# Patient Record
Sex: Male | Born: 1935 | Race: White | Hispanic: No | Marital: Married | Smoking: Former smoker
Health system: Southern US, Community
[De-identification: ages and names within clinical notes are randomized; demographics above are authoritative.]

## PROBLEM LIST (undated history)

## (undated) DIAGNOSIS — K648 Other hemorrhoids: Secondary | ICD-10-CM

## (undated) DIAGNOSIS — K222 Esophageal obstruction: Secondary | ICD-10-CM

## (undated) DIAGNOSIS — M1990 Unspecified osteoarthritis, unspecified site: Secondary | ICD-10-CM

## (undated) DIAGNOSIS — E785 Hyperlipidemia, unspecified: Secondary | ICD-10-CM

## (undated) DIAGNOSIS — J189 Pneumonia, unspecified organism: Secondary | ICD-10-CM

## (undated) DIAGNOSIS — K449 Diaphragmatic hernia without obstruction or gangrene: Secondary | ICD-10-CM

## (undated) DIAGNOSIS — J439 Emphysema, unspecified: Secondary | ICD-10-CM

## (undated) DIAGNOSIS — I10 Essential (primary) hypertension: Secondary | ICD-10-CM

## (undated) DIAGNOSIS — N183 Chronic kidney disease, stage 3 unspecified: Secondary | ICD-10-CM

## (undated) DIAGNOSIS — K219 Gastro-esophageal reflux disease without esophagitis: Secondary | ICD-10-CM

## (undated) DIAGNOSIS — C159 Malignant neoplasm of esophagus, unspecified: Secondary | ICD-10-CM

## (undated) DIAGNOSIS — I480 Paroxysmal atrial fibrillation: Secondary | ICD-10-CM

## (undated) DIAGNOSIS — K5790 Diverticulosis of intestine, part unspecified, without perforation or abscess without bleeding: Secondary | ICD-10-CM

## (undated) DIAGNOSIS — I639 Cerebral infarction, unspecified: Secondary | ICD-10-CM

## (undated) DIAGNOSIS — K2270 Barrett's esophagus without dysplasia: Secondary | ICD-10-CM

## (undated) HISTORY — PX: CATARACT EXTRACTION W/ INTRAOCULAR LENS  IMPLANT, BILATERAL: SHX1307

## (undated) HISTORY — DX: Chronic kidney disease, stage 3 unspecified: N18.30

## (undated) HISTORY — DX: Diaphragmatic hernia without obstruction or gangrene: K44.9

## (undated) HISTORY — DX: Diverticulosis of intestine, part unspecified, without perforation or abscess without bleeding: K57.90

## (undated) HISTORY — DX: Hyperlipidemia, unspecified: E78.5

## (undated) HISTORY — PX: INGUINAL HERNIA REPAIR: SUR1180

## (undated) HISTORY — DX: Barrett's esophagus without dysplasia: K22.70

## (undated) HISTORY — DX: Gastro-esophageal reflux disease without esophagitis: K21.9

## (undated) HISTORY — PX: APPENDECTOMY: SHX54

## (undated) HISTORY — DX: Malignant neoplasm of esophagus, unspecified: C15.9

## (undated) HISTORY — DX: Chronic kidney disease, stage 3 (moderate): N18.3

## (undated) HISTORY — DX: Other hemorrhoids: K64.8

## (undated) HISTORY — PX: TONSILLECTOMY: SUR1361

## (undated) HISTORY — DX: Paroxysmal atrial fibrillation: I48.0

## (undated) HISTORY — DX: Unspecified osteoarthritis, unspecified site: M19.90

## (undated) HISTORY — DX: Cerebral infarction, unspecified: I63.9

## (undated) HISTORY — PX: CYSTOSCOPY W/ URETEROSCOPY W/ LITHOTRIPSY: SUR380

## (undated) HISTORY — DX: Essential (primary) hypertension: I10

## (undated) HISTORY — DX: Esophageal obstruction: K22.2

---

## 1988-05-07 DIAGNOSIS — K2270 Barrett's esophagus without dysplasia: Secondary | ICD-10-CM

## 1988-05-07 HISTORY — DX: Barrett's esophagus without dysplasia: K22.70

## 1995-02-05 DIAGNOSIS — C159 Malignant neoplasm of esophagus, unspecified: Secondary | ICD-10-CM

## 1995-02-05 HISTORY — DX: Malignant neoplasm of esophagus, unspecified: C15.9

## 1995-02-05 HISTORY — PX: PARTIAL ESOPHAGECTOMY: SHX5287

## 1999-03-03 ENCOUNTER — Ambulatory Visit (HOSPITAL_COMMUNITY): Admission: RE | Admit: 1999-03-03 | Discharge: 1999-03-04 | Payer: Self-pay | Admitting: Nephrology

## 1999-03-03 ENCOUNTER — Encounter: Payer: Self-pay | Admitting: Nephrology

## 1999-03-05 ENCOUNTER — Emergency Department (HOSPITAL_COMMUNITY): Admission: EM | Admit: 1999-03-05 | Discharge: 1999-03-05 | Payer: Self-pay | Admitting: Emergency Medicine

## 1999-09-07 ENCOUNTER — Encounter: Payer: Self-pay | Admitting: Urology

## 1999-09-07 ENCOUNTER — Encounter: Admission: RE | Admit: 1999-09-07 | Discharge: 1999-09-07 | Payer: Self-pay | Admitting: Urology

## 2000-03-05 ENCOUNTER — Encounter: Admission: RE | Admit: 2000-03-05 | Discharge: 2000-03-05 | Payer: Self-pay | Admitting: Urology

## 2000-03-05 ENCOUNTER — Encounter: Payer: Self-pay | Admitting: Urology

## 2000-08-20 ENCOUNTER — Encounter: Payer: Self-pay | Admitting: Gastroenterology

## 2000-08-20 ENCOUNTER — Ambulatory Visit (HOSPITAL_COMMUNITY): Admission: RE | Admit: 2000-08-20 | Discharge: 2000-08-20 | Payer: Self-pay | Admitting: Gastroenterology

## 2000-09-18 ENCOUNTER — Encounter: Payer: Self-pay | Admitting: Gastroenterology

## 2000-09-18 ENCOUNTER — Ambulatory Visit (HOSPITAL_COMMUNITY): Admission: RE | Admit: 2000-09-18 | Discharge: 2000-09-18 | Payer: Self-pay | Admitting: Gastroenterology

## 2001-03-31 ENCOUNTER — Encounter: Admission: RE | Admit: 2001-03-31 | Discharge: 2001-03-31 | Payer: Self-pay | Admitting: Cardiothoracic Surgery

## 2001-03-31 ENCOUNTER — Encounter: Payer: Self-pay | Admitting: Cardiothoracic Surgery

## 2001-07-18 ENCOUNTER — Encounter: Admission: RE | Admit: 2001-07-18 | Discharge: 2001-07-18 | Payer: Self-pay | Admitting: Urology

## 2001-07-18 ENCOUNTER — Encounter: Payer: Self-pay | Admitting: Urology

## 2002-08-04 ENCOUNTER — Encounter: Admission: RE | Admit: 2002-08-04 | Discharge: 2002-08-04 | Payer: Self-pay | Admitting: Urology

## 2002-08-04 ENCOUNTER — Encounter: Payer: Self-pay | Admitting: Urology

## 2003-08-06 HISTORY — PX: CYSTOSCOPY WITH STENT PLACEMENT: SHX5790

## 2003-08-12 ENCOUNTER — Encounter: Admission: RE | Admit: 2003-08-12 | Discharge: 2003-08-12 | Payer: Self-pay | Admitting: Urology

## 2003-08-16 ENCOUNTER — Ambulatory Visit (HOSPITAL_BASED_OUTPATIENT_CLINIC_OR_DEPARTMENT_OTHER): Admission: RE | Admit: 2003-08-16 | Discharge: 2003-08-16 | Payer: Self-pay | Admitting: Urology

## 2003-08-16 ENCOUNTER — Ambulatory Visit (HOSPITAL_COMMUNITY): Admission: RE | Admit: 2003-08-16 | Discharge: 2003-08-16 | Payer: Self-pay | Admitting: Urology

## 2003-08-19 ENCOUNTER — Ambulatory Visit (HOSPITAL_COMMUNITY): Admission: RE | Admit: 2003-08-19 | Discharge: 2003-08-19 | Payer: Self-pay | Admitting: Urology

## 2005-11-21 ENCOUNTER — Ambulatory Visit: Payer: Self-pay | Admitting: Gastroenterology

## 2005-12-24 ENCOUNTER — Ambulatory Visit (HOSPITAL_COMMUNITY): Admission: RE | Admit: 2005-12-24 | Discharge: 2005-12-24 | Payer: Self-pay | Admitting: Gastroenterology

## 2005-12-26 ENCOUNTER — Ambulatory Visit: Payer: Self-pay | Admitting: Gastroenterology

## 2006-12-25 ENCOUNTER — Ambulatory Visit: Payer: Self-pay | Admitting: Gastroenterology

## 2007-01-31 ENCOUNTER — Ambulatory Visit: Payer: Self-pay | Admitting: Gastroenterology

## 2008-09-03 ENCOUNTER — Encounter: Payer: Self-pay | Admitting: Internal Medicine

## 2008-09-10 DIAGNOSIS — I4891 Unspecified atrial fibrillation: Secondary | ICD-10-CM

## 2008-09-13 ENCOUNTER — Ambulatory Visit: Payer: Self-pay | Admitting: Internal Medicine

## 2008-10-05 ENCOUNTER — Telehealth: Payer: Self-pay | Admitting: Internal Medicine

## 2008-10-05 ENCOUNTER — Encounter: Payer: Self-pay | Admitting: Internal Medicine

## 2008-10-05 HISTORY — PX: ATRIAL FIBRILLATION ABLATION: SHX5732

## 2008-10-11 ENCOUNTER — Telehealth: Payer: Self-pay | Admitting: Internal Medicine

## 2008-10-11 ENCOUNTER — Encounter: Payer: Self-pay | Admitting: Internal Medicine

## 2008-10-13 ENCOUNTER — Encounter: Payer: Self-pay | Admitting: Internal Medicine

## 2008-10-22 ENCOUNTER — Encounter: Payer: Self-pay | Admitting: Internal Medicine

## 2008-10-22 LAB — CONVERTED CEMR LAB: Protime: 48.9

## 2008-10-26 ENCOUNTER — Encounter: Payer: Self-pay | Admitting: Internal Medicine

## 2008-10-28 ENCOUNTER — Ambulatory Visit (HOSPITAL_COMMUNITY): Admission: RE | Admit: 2008-10-28 | Discharge: 2008-10-28 | Payer: Self-pay | Admitting: Cardiology

## 2008-10-28 ENCOUNTER — Encounter: Payer: Self-pay | Admitting: Internal Medicine

## 2008-10-29 ENCOUNTER — Ambulatory Visit: Payer: Self-pay | Admitting: Internal Medicine

## 2008-10-29 ENCOUNTER — Inpatient Hospital Stay (HOSPITAL_COMMUNITY): Admission: RE | Admit: 2008-10-29 | Discharge: 2008-10-30 | Payer: Self-pay | Admitting: Internal Medicine

## 2008-11-08 ENCOUNTER — Ambulatory Visit: Payer: Self-pay | Admitting: Internal Medicine

## 2008-11-08 ENCOUNTER — Inpatient Hospital Stay (HOSPITAL_COMMUNITY): Admission: EM | Admit: 2008-11-08 | Discharge: 2008-11-09 | Payer: Self-pay | Admitting: Emergency Medicine

## 2008-11-19 ENCOUNTER — Ambulatory Visit: Payer: Self-pay | Admitting: Internal Medicine

## 2008-11-29 ENCOUNTER — Encounter: Payer: Self-pay | Admitting: Internal Medicine

## 2009-01-04 ENCOUNTER — Encounter: Payer: Self-pay | Admitting: Internal Medicine

## 2009-01-28 ENCOUNTER — Telehealth: Payer: Self-pay | Admitting: Internal Medicine

## 2009-02-01 DIAGNOSIS — R079 Chest pain, unspecified: Secondary | ICD-10-CM

## 2009-02-01 DIAGNOSIS — E785 Hyperlipidemia, unspecified: Secondary | ICD-10-CM

## 2009-02-01 DIAGNOSIS — I10 Essential (primary) hypertension: Secondary | ICD-10-CM

## 2009-02-01 DIAGNOSIS — K2270 Barrett's esophagus without dysplasia: Secondary | ICD-10-CM | POA: Insufficient documentation

## 2009-02-01 DIAGNOSIS — K219 Gastro-esophageal reflux disease without esophagitis: Secondary | ICD-10-CM | POA: Insufficient documentation

## 2009-02-01 DIAGNOSIS — M109 Gout, unspecified: Secondary | ICD-10-CM

## 2009-02-02 ENCOUNTER — Ambulatory Visit: Payer: Self-pay | Admitting: Internal Medicine

## 2009-02-23 ENCOUNTER — Telehealth: Payer: Self-pay | Admitting: Internal Medicine

## 2009-02-25 ENCOUNTER — Telehealth: Payer: Self-pay | Admitting: Internal Medicine

## 2009-03-10 ENCOUNTER — Ambulatory Visit: Payer: Self-pay | Admitting: Internal Medicine

## 2009-04-25 ENCOUNTER — Encounter: Payer: Self-pay | Admitting: Internal Medicine

## 2009-05-03 ENCOUNTER — Ambulatory Visit: Payer: Self-pay | Admitting: Internal Medicine

## 2009-05-03 ENCOUNTER — Ambulatory Visit: Payer: Self-pay | Admitting: Cardiology

## 2009-05-03 ENCOUNTER — Encounter (INDEPENDENT_AMBULATORY_CARE_PROVIDER_SITE_OTHER): Payer: Self-pay | Admitting: *Deleted

## 2009-05-03 LAB — CONVERTED CEMR LAB
CO2: 30 meq/L (ref 19–32)
Calcium: 8.6 mg/dL (ref 8.4–10.5)
Chloride: 105 meq/L (ref 96–112)
HCT: 41.4 % (ref 39.0–52.0)
Hemoglobin: 14.2 g/dL (ref 13.0–17.0)
INR: 3.2 — ABNORMAL HIGH (ref 0.8–1.0)
MCHC: 34.2 g/dL (ref 30.0–36.0)
MCV: 97.2 fL (ref 78.0–100.0)
Neutrophils Relative %: 60 % (ref 43.0–77.0)
POC INR: 3.2
Platelets: 159 10*3/uL (ref 150.0–400.0)
Potassium: 4.5 meq/L (ref 3.5–5.1)
Prothrombin Time: 33.4 s — ABNORMAL HIGH (ref 9.1–11.7)
RBC: 4.26 M/uL (ref 4.22–5.81)
RDW: 12.8 % (ref 11.5–14.6)
Sodium: 140 meq/L (ref 135–145)

## 2009-05-05 ENCOUNTER — Ambulatory Visit: Payer: Self-pay | Admitting: Cardiovascular Disease

## 2009-05-05 ENCOUNTER — Ambulatory Visit (HOSPITAL_COMMUNITY): Admission: RE | Admit: 2009-05-05 | Discharge: 2009-05-05 | Payer: Self-pay | Admitting: Cardiovascular Disease

## 2009-05-07 HISTORY — PX: CARDIAC ELECTROPHYSIOLOGY STUDY AND ABLATION: SHX1294

## 2009-05-07 HISTORY — PX: ATRIAL ABLATION SURGERY: SHX560

## 2009-05-09 ENCOUNTER — Ambulatory Visit: Payer: Self-pay | Admitting: Cardiology

## 2009-05-09 ENCOUNTER — Encounter (INDEPENDENT_AMBULATORY_CARE_PROVIDER_SITE_OTHER): Payer: Self-pay | Admitting: Cardiology

## 2009-05-10 ENCOUNTER — Ambulatory Visit: Payer: Self-pay | Admitting: Internal Medicine

## 2009-05-10 ENCOUNTER — Inpatient Hospital Stay (HOSPITAL_COMMUNITY): Admission: RE | Admit: 2009-05-10 | Discharge: 2009-05-11 | Payer: Self-pay | Admitting: Internal Medicine

## 2009-05-19 ENCOUNTER — Ambulatory Visit: Payer: Self-pay | Admitting: Cardiology

## 2009-05-19 LAB — CONVERTED CEMR LAB: POC INR: 2.5

## 2009-06-16 ENCOUNTER — Ambulatory Visit: Payer: Self-pay | Admitting: Cardiology

## 2009-06-16 ENCOUNTER — Encounter (INDEPENDENT_AMBULATORY_CARE_PROVIDER_SITE_OTHER): Payer: Self-pay | Admitting: Cardiology

## 2009-06-16 LAB — CONVERTED CEMR LAB: POC INR: 2.6

## 2009-07-18 ENCOUNTER — Ambulatory Visit: Payer: Self-pay | Admitting: Cardiology

## 2009-07-18 LAB — CONVERTED CEMR LAB: POC INR: 2.2

## 2009-08-12 ENCOUNTER — Ambulatory Visit: Payer: Self-pay | Admitting: Cardiology

## 2009-08-12 ENCOUNTER — Ambulatory Visit: Payer: Self-pay | Admitting: Internal Medicine

## 2009-08-12 LAB — CONVERTED CEMR LAB: POC INR: 1.9

## 2009-09-09 ENCOUNTER — Ambulatory Visit: Payer: Self-pay | Admitting: Cardiology

## 2009-10-07 ENCOUNTER — Ambulatory Visit: Payer: Self-pay | Admitting: Cardiovascular Disease

## 2009-10-07 LAB — CONVERTED CEMR LAB: POC INR: 2.4

## 2009-11-14 ENCOUNTER — Ambulatory Visit: Payer: Self-pay | Admitting: Internal Medicine

## 2009-11-14 ENCOUNTER — Encounter: Payer: Self-pay | Admitting: Cardiology

## 2009-12-08 ENCOUNTER — Telehealth: Payer: Self-pay | Admitting: Internal Medicine

## 2010-01-27 ENCOUNTER — Encounter: Payer: Self-pay | Admitting: Cardiology

## 2010-02-02 IMAGING — CR DG CHEST 1V PORT
1 series · 1 of 1 positions shown · non-contrast
Comparison: Portable exam 7925 hours compared to 08/12/2003

CLINICAL DATA: Chest pain, history atrial fibrillation,
hypertension

PORTABLE CHEST - 1 VIEW

[AP]
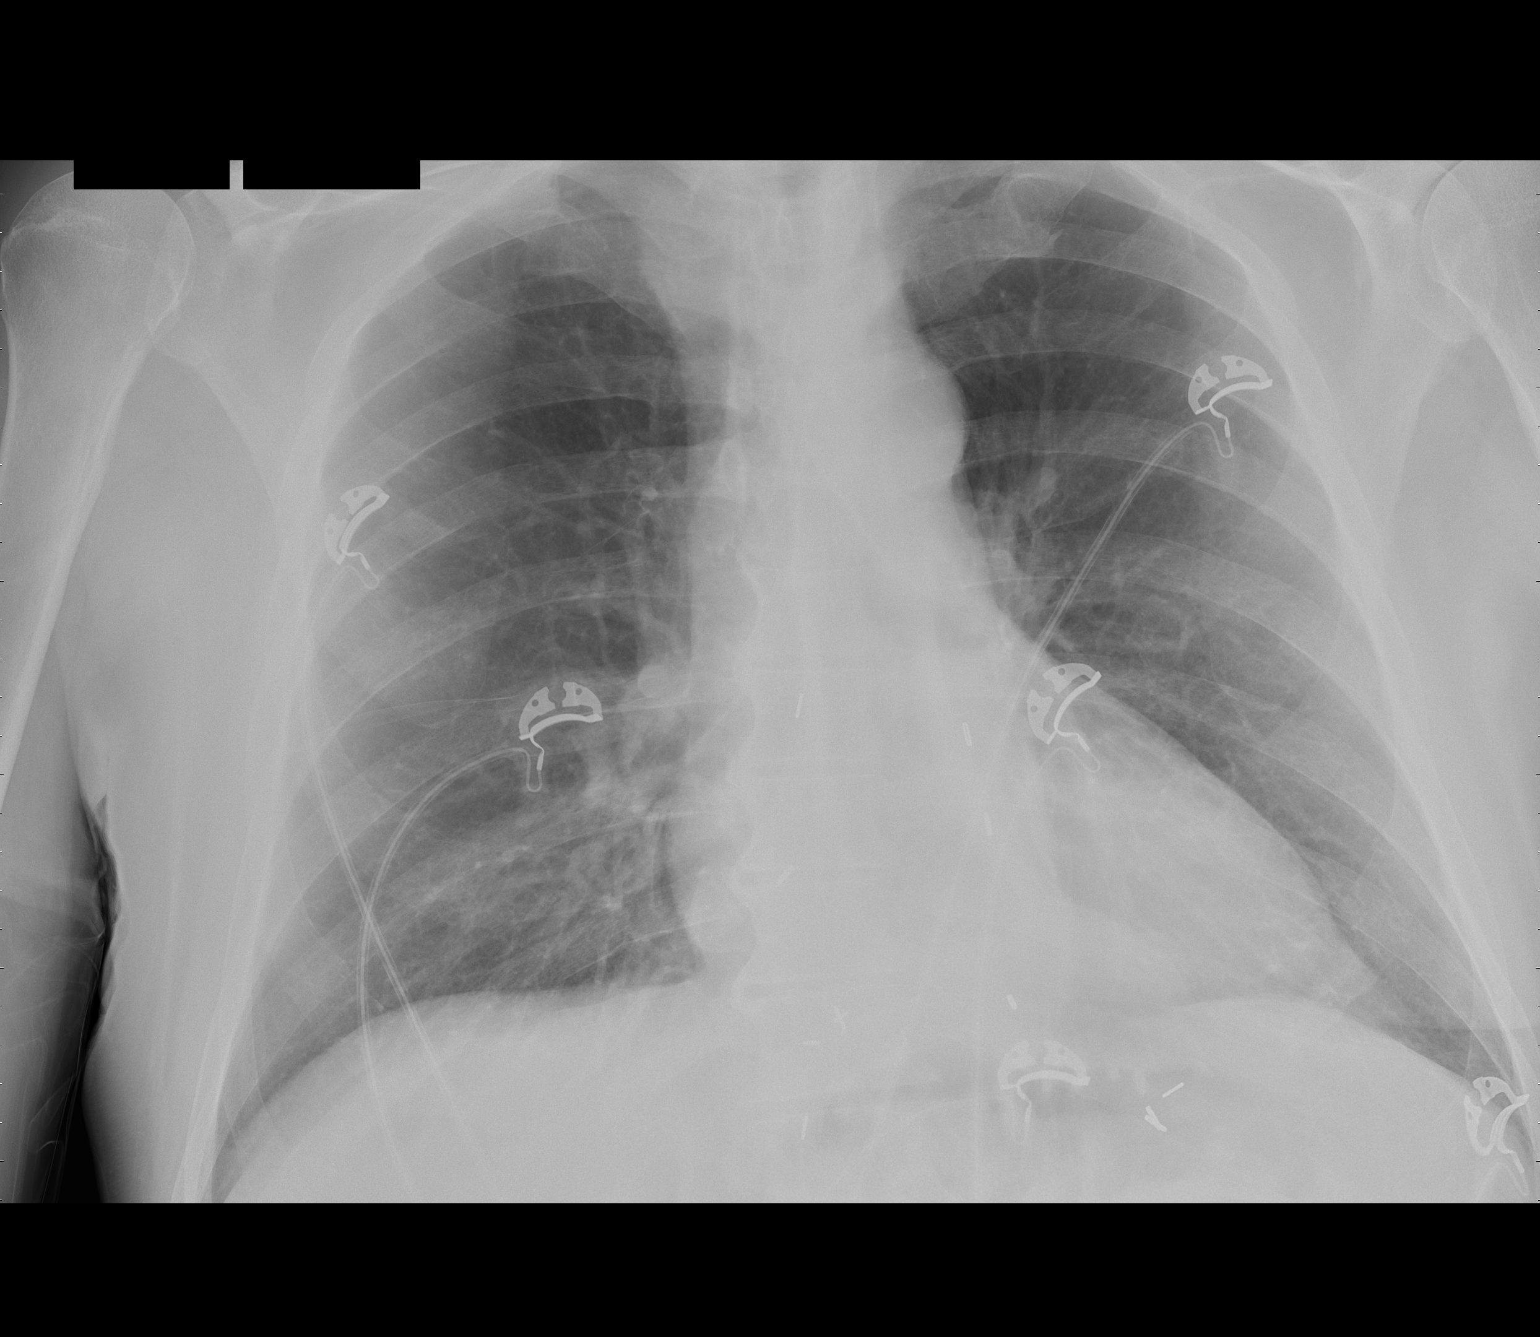

[1 of 1 positions shown; findings below may reference images not displayed]

FINDINGS: Mild cardiac enlargement.
Surgical clips in mediastinum and upper abdomen.
Mediastinal contours and pulmonary vascularity normal.
Mild right basilar atelectasis.
Remaining lungs clear.
IMPRESSION: Cardiomegaly.
Mild right basilar atelectasis.

## 2010-03-16 ENCOUNTER — Ambulatory Visit: Payer: Self-pay | Admitting: Internal Medicine

## 2010-05-02 ENCOUNTER — Encounter: Payer: Self-pay | Admitting: Internal Medicine

## 2010-05-24 ENCOUNTER — Telehealth: Payer: Self-pay | Admitting: Internal Medicine

## 2010-06-04 LAB — CONVERTED CEMR LAB
BUN: 20 mg/dL (ref 6–23)
Chloride: 106 meq/L (ref 96–112)
Glucose, Bld: 134 mg/dL — ABNORMAL HIGH (ref 70–99)
Potassium: 4.6 meq/L (ref 3.5–5.1)

## 2010-06-06 NOTE — Assessment & Plan Note (Signed)
Summary: 6 month follow up/mt   Visit Type:  Follow-up Referring Provider:  Dr Elsie Lincoln Primary Provider:  Dr Evlyn Kanner   History of Present Illness: The patient presents today for routine electrophysiology followup. He reports doing very well since his afib ablation.  He has had no recent episodes of afib and is off of antiarrhythymic medicine.  He takes ASA 325mg  daily for stroke prevention.  The patient denies symptoms of palpitations, chest pain, shortness of breath, orthopnea, PND, lower extremity edema, dizziness, presyncope, syncope, or neurologic sequela.  He has had a nonproductive cough x 5 days without fevers, chills, or SOB.  The patient is tolerating medications without difficulties and is otherwise without complaint today.   Current Medications (verified): 1)  Allopurinol 300 Mg Tabs (Allopurinol) .... 1/2 Once Daily 2)  Crestor 10 Mg Tabs (Rosuvastatin Calcium) .... One Tab Tues. and Fri. 3)  Lisinopril 5 Mg Tabs (Lisinopril) .... One By Mouth Daily 4)  Aspirin Ec 325 Mg Tbec (Aspirin) .... Take One Tablet By Mouth Daily  Allergies (verified): No Known Drug Allergies  Past History:  Past Medical History: PAROXYSMAL ATRIAL FIBRILLATION s/p PVI 1/11 BARRETTS ESOPHAGUS (ICD-530.85) HYPERTENSION (ICD-401.9) GOUT (ICD-274.9) DYSLIPIDEMIA (ICD-272.4) GERD (ICD-530.81) Esophageal Cancer s/p resection 1996 Prior esophageal postoperative stricture s/p dilatation 2 years ago CRI (baseline creatinine 1.6) History of esophageal cancer in the past remotely.   Past Surgical History: S/p esophageal surgery for malignancy 1996 bilateral inguinal hernia repair 1987, 1996 s/p afib ablation 05/10/09  Social History: Reviewed history from 09/13/2008 and no changes required. The patient lives in [place] with wife.  Retired from [employer]. Married.  5 grown children. Tobacco Use - quit 1960 Alcohol Use - drinks 2 beers each day Denies drug use  Review of Systems       All systems are  reviewed and negative except as listed in the HPI.   Vital Signs:  Patient profile:   77 year old male Height:      69 inches Weight:      152 pounds BMI:     22.53 Pulse rate:   74 / minute BP supine:   162 / 90  (left arm)  Vitals Entered By: Laurance Flatten CMA (March 16, 2010 9:17 AM)  Physical Exam  General:  Well developed, well nourished, in no acute distress. Head:  normocephalic and atraumatic Eyes:  PERRLA/EOM intact; conjunctiva and lids normal. Mouth:  Teeth, gums and palate normal. Oral mucosa normal. Neck:  Neck supple, no JVD. No masses, thyromegaly or abnormal cervical nodes. Lungs:  Clear bilaterally to auscultation and percussion. Heart:  Non-displaced PMI, chest non-tender; regular rate and rhythm, S1, S2 without murmurs, rubs or gallops. Carotid upstroke normal, no bruit. Normal abdominal aortic size, no bruits. Femorals normal pulses, no bruits. Pedals normal pulses. No edema, no varicosities. Abdomen:  Bowel sounds positive; abdomen soft and non-tender without masses, organomegaly, or hernias noted. No hepatosplenomegaly. Msk:  Back normal, normal gait. Muscle strength and tone normal. Pulses:  pulses normal in all 4 extremities Extremities:  No clubbing or cyanosis. Neurologic:  Alert and oriented x 3.   Impression & Recommendations:  Problem # 1:  PAROXYSMAL ATRIAL FIBRILLATION (ICD-427.31) resolved s/p ablation, without recurrence His CHADS2 score is 2.  I have recommended that he continue lifelong coumadin or pradaxa as we do not have data to support stopping coumadin s/p ablation.  He has decided to switch to ASA 325mg  daily anyway.  Problem # 2:  HYPERTENSION (ICD-401.9)  above goal  increase lisinopril 10mg  daily pt aware that he will need BMET by PCP in 6 weeks  The following medications were removed from the medication list:    Ramipril 10 Mg Caps (Ramipril) ..... One by mouth daily His updated medication list for this problem includes:     Lisinopril 10 Mg Tabs (Lisinopril) ..... One by mouth daily    Aspirin Ec 325 Mg Tbec (Aspirin) .Marland Kitchen... Take one tablet by mouth daily  Problem # 3:  DYSLIPIDEMIA (ICD-272.4)  stable His updated medication list for this problem includes:    Crestor 10 Mg Tabs (Rosuvastatin calcium) ..... One tab tues. and fri.  His updated medication list for this problem includes:    Crestor 10 Mg Tabs (Rosuvastatin calcium) ..... One tab tues. and fri.  Patient Instructions: 1)  Your physician wants you to follow-up in: 12months with Dr Jacquiline Doe will receive a reminder letter in the mail two months in advance. If you don't receive a letter, please call our office to schedule the follow-up appointment. 2)  Your physician recommends that you return for lab work in: 6 weeks with your primary MD-- BMP 3)  Your physician has recommended you make the following change in your medication: increase Lisinopril to 10mg  daily Prescriptions: LISINOPRIL 10 MG TABS (LISINOPRIL) one by mouth daily  #30 x 11   Entered by:   Dennis Bast, RN, BSN   Authorized by:   Hillis Range, MD   Signed by:   Dennis Bast, RN, BSN on 03/16/2010   Method used:   Electronically to        [facility] (retail)       [address]       [place], [place]  [address]       Ph: [phone]       Fax: [phone]   RxID:   [id number]   Appended Document: 6 month follow up/mt ekg today reveals sinus rhythm 70 bpm, PR 202, otherwise normal ekg

## 2010-06-06 NOTE — Medication Information (Signed)
Summary: [facility] Clinic  Anticoagulant Therapy  Managed by: Inactive Referring MD: Dr. Johney Frame PCP: Dr Evlyn Kanner Supervising MD: Myrtis Ser MD, Tinnie Gens Indication 1: Atrial Fibrillation Lab Used: [facility] [place] Site: [address] INR RANGE 2.0-3.0          Comments: Pt is now on ASA and off Coumadin per phone note from 12/08/09. Cloyde Reams RN  January 27, 2010 9:42 AM   Allergies: No Known Drug Allergies  Anticoagulation Management History:      Positive risk factors for bleeding include an age of 75 years or older and presence of serious comorbidities.  The bleeding index is 'intermediate risk'.  Positive CHADS2 values include History of HTN and Age > 75 years old.  His last INR was 3.2 ratio.  Anticoagulation responsible provider: Myrtis Ser MD, Tinnie Gens.  Exp: 01/2011.    Anticoagulation Management Assessment/Plan:      The patient's current anticoagulation dose is Warfarin sodium 5 mg tabs: Take as directed by coumadin clinic..  The target INR is 2.0-3.0.  The next INR is due 12/12/2009.  Anticoagulation instructions were given to patient.  Results were reviewed/authorized by Inactive.         Prior Anticoagulation Instructions: INR 2.2  Continue same regimen of 0.5 tab on Sunday, Tuesday, Thursday, and Saturday and 1 tab on Monday, Wednesday, and Friday.  Re-check INR in 4 weeks.

## 2010-06-06 NOTE — Progress Notes (Signed)
Summary: wants to talk with kelly re pt test  Phone Note Call from Patient   Caller: Patient Reason for Call: Talk to Nurse Summary of Call: pt wants to talk with kelly re pt test-pls call [phone] Initial call taken by: Glynda Jaeger,  December 08, 2009 8:47 AM  Follow-up for Phone Call        Pt decided to go with ASA not Coumadin Cx PT  increased Lisinopril and BP was 90/? (wife couldn't get) but dizzy and lightheaded, decreased dose back to 5mg  daily and BP have been on average 120/70 for over a week.  Just an FYI.  Wants a new rx for 5mg  called in if you are okay with the new dose.  [facility] on [address].  Let pt know Dr Johney Frame out until next week.  Will call him back after Dr Johney Frame reviews Dennis Bast, RN, BSN  December 08, 2009 9:20 AM  Additional Follow-up for Phone Call Additional follow up Details #1::        OK to treat with Lisinopril 5mg  daily Additional Follow-up by: Hillis Range, MD,  December 12, 2009 5:49 PM    New/Updated Medications: LISINOPRIL 5 MG TABS (LISINOPRIL) one by mouth daily Prescriptions: LISINOPRIL 5 MG TABS (LISINOPRIL) one by mouth daily  #30 x 11   Entered by:   Dennis Bast, RN, BSN   Authorized by:   Hillis Range, MD   Signed by:   Dennis Bast, RN, BSN on 12/13/2009   Method used:   Electronically to        [facility] (retail)       [address]       [place], [place]  [address]       Ph: [phone]       Fax: [phone]   RxID:   [id number]

## 2010-06-06 NOTE — Assessment & Plan Note (Signed)
Summary: PER CHECK OUT/SF   Referring Provider:  Dr Elsie Lincoln Primary Provider:  Dr Evlyn Kanner   History of Present Illness: The patient presents today for routine electrophysiology followup. He reports doing very well since his afib ablation  He has had no recent episodes of afib.  The patient denies symptoms of palpitations, chest pain, shortness of breath, orthopnea, PND, lower extremity edema, dizziness, presyncope, syncope, or neurologic sequela. The patient is tolerating medications without difficulties and is otherwise without complaint today.   Current Medications (verified): 1)  Altace 5 Mg Caps (Ramipril) .... Take One Capsule  By Mouth Once Daily. 2)  Allopurinol 300 Mg Tabs (Allopurinol) .... 1/2 Once Daily 3)  Warfarin Sodium 5 Mg Tabs (Warfarin Sodium) .... Take As Directed By Coumadin Clinic. 4)  Crestor 10 Mg Tabs (Rosuvastatin Calcium) .... One Tab Tues. and Fri. 5)  Multivitamins   Tabs (Multiple Vitamin) .... Once Daily  Allergies (verified): No Known Drug Allergies  Past History:  Past Medical History: Reviewed history from 02/02/2009 and no changes required. PAROXYSMAL ATRIAL FIBRILLATION (ICD-427.31) BARRETTS ESOPHAGUS (ICD-530.85) HYPERTENSION (ICD-401.9) GOUT (ICD-274.9) DYSLIPIDEMIA (ICD-272.4) GERD (ICD-530.81) Esophageal Cancer s/p resection 1996 Prior esophageal postoperative stricture s/p dilatation 2 years ago CRI (baseline creatinine 1.6) History of esophageal cancer in the past remotely.  anticoagulation.  Past Surgical History: Reviewed history from 08/12/2009 and no changes required. S/p esophageal surgery for malignancy 1996 bilateral inguinal hernia repair 1987, 1996 s/p afib ablation 1/11  Social History: Reviewed history from 09/13/2008 and no changes required. The patient lives in [place] with wife.  Retired from [employer]. Married.  5 grown children. Tobacco Use - quit 1960 Alcohol Use - drinks 2 beers each day Denies drug use  Review of  Systems       All systems are reviewed and negative except as listed in the HPI.   Vital Signs:  Patient profile:   77 year old male Height:      69 inches Weight:      156 pounds BMI:     23.12 Pulse rate:   78 / minute BP sitting:   160 / 80  (left arm)  Vitals Entered By: Laurance Flatten CMA (November 14, 2009 9:39 AM)  Physical Exam  General:  Well developed, well nourished, in no acute distress. Head:  normocephalic and atraumatic Mouth:  Teeth, gums and palate normal. Oral mucosa normal. Neck:  Neck supple, no JVD. No masses, thyromegaly or abnormal cervical nodes. Lungs:  Clear bilaterally to auscultation and percussion. Heart:  Non-displaced PMI, chest non-tender; regular rate and rhythm, S1, S2 without murmurs, rubs or gallops. Carotid upstroke normal, no bruit. Normal abdominal aortic size, no bruits. Femorals normal pulses, no bruits. Pedals normal pulses. No edema, no varicosities. Abdomen:  Bowel sounds positive; abdomen soft and non-tender without masses, organomegaly, or hernias noted. No hepatosplenomegaly. Msk:  Back normal, normal gait. Muscle strength and tone normal. Pulses:  pulses normal in all 4 extremities Extremities:  No clubbing or cyanosis. Neurologic:  Alert and oriented x 3.  CNII-XII intact, strength/ sensation intact Skin:  Intact without lesions or rashes. Psych:  Normal affect.   EKG  Procedure date:  11/14/2009  Findings:      sinus rhythm 76 bpm, PR 212, LAD, otherwise normal ekg  Impression & Recommendations:  Problem # 1:  PAROXYSMAL ATRIAL FIBRILLATION (ICD-427.31) resolved s/p ablation His CHADS2 score is 2.  I have recommended that he continue lifelong coumadin as we do not have data to support stopping  coumadin s/p ablation.  He is considering switching to ASA anyway. He will continue coumadin for now at my recommendation.  Problem # 2:  HYPERTENSION (ICD-401.9)  above goal increase ramipril 10mg  daily check BMET today he will need  BMET by PCP in 6 weeks also  His updated medication list for this problem includes:    Ramipril 10 Mg Caps (Ramipril) ..... One by mouth daily  Other Orders: TLB-BMP (Basic Metabolic Panel-BMET) (80048-METABOL)  Patient Instructions: 1)  Your physician recommends that you schedule a follow-up appointment in: 6 months with Dr Johney Frame 2)  Your physician recommends that you return for lab work today 3)  Your physician has recommended you make the following change in your medication: increase Ramipril to 10mg  daily Prescriptions: RAMIPRIL 10 MG CAPS (RAMIPRIL) one by mouth daily  #30 x 11   Entered by:   Dennis Bast, RN, BSN   Authorized by:   Hillis Range, MD   Signed by:   Dennis Bast, RN, BSN on 11/14/2009   Method used:   Electronically to        [facility] (retail)       [address]       [place], [place]  [address]       Ph: [phone]       Fax: [phone]   RxID:   [id number]

## 2010-06-06 NOTE — Assessment & Plan Note (Signed)
Summary: eph   Visit Type:  Follow-up Referring Provider:  Dr Elsie Lincoln Primary Provider:  Dr Evlyn Kanner   History of Present Illness: The patient presents today for routine electrophysiology followup. He reports doing very well since his afib ablation  He has had no recent episodes of afib.  He had only 2 episodes of afib in early January (ERAF) but none since.  He reports no postprocedural complications. The patient denies symptoms of palpitations, chest pain, shortness of breath, orthopnea, PND, lower extremity edema, dizziness, presyncope, syncope, or neurologic sequela. The patient is tolerating medications without difficulties and is otherwise without complaint today.   Current Medications (verified): 1)  Altace 2.5 Mg Caps (Ramipril) .... Take 1 By Mouth Once Daily 2)  Allopurinol 300 Mg Tabs (Allopurinol) .... 1/2 Once Daily 3)  Warfarin Sodium 5 Mg Tabs (Warfarin Sodium) .... Take As Directed By Coumadin Clinic. 4)  Crestor 10 Mg Tabs (Rosuvastatin Calcium) .... One Tab Tues. and Fri. 5)  Prilosec 20 Mg Cpdr (Omeprazole) .... Once Daily 6)  Flecainide Acetate 100 Mg Tabs (Flecainide Acetate) .... Take One Tablet By Mouth Every 12 Hours  Allergies (verified): No Known Drug Allergies  Past History:  Past Medical History: Reviewed history from 02/02/2009 and no changes required. PAROXYSMAL ATRIAL FIBRILLATION (ICD-427.31) BARRETTS ESOPHAGUS (ICD-530.85) HYPERTENSION (ICD-401.9) GOUT (ICD-274.9) DYSLIPIDEMIA (ICD-272.4) GERD (ICD-530.81) Esophageal Cancer s/p resection 1996 Prior esophageal postoperative stricture s/p dilatation 2 years ago CRI (baseline creatinine 1.6) History of esophageal cancer in the past remotely.  anticoagulation.  Past Surgical History: S/p esophageal surgery for malignancy 1996 bilateral inguinal hernia repair 1987, 1996 s/p afib ablation 1/11  Social History: Reviewed history from 09/13/2008 and no changes required. The patient lives in [place]  with wife.  Retired from [employer]. Married.  5 grown children. Tobacco Use - quit 1960 Alcohol Use - drinks 2 beers each day Denies drug use  Review of Systems       All systems are reviewed and negative except as listed in the HPI.   Vital Signs:  Patient profile:   77 year old male Height:      69 inches Weight:      155 pounds BMI:     22.97 Pulse rate:   71 / minute BP sitting:   164 / 90  (left arm)  Vitals Entered By: Laurance Flatten CMA (August 12, 2009 9:36 AM)  Physical Exam  General:  Well developed, well nourished, in no acute distress. Head:  normocephalic and atraumatic Eyes:  PERRLA/EOM intact; conjunctiva and lids normal. Mouth:  Teeth, gums and palate normal. Oral mucosa normal. Neck:  Neck supple, no JVD. No masses, thyromegaly or abnormal cervical nodes. Lungs:  Clear bilaterally to auscultation and percussion. Heart:  Non-displaced PMI, chest non-tender; regular rate and rhythm, S1, S2 without murmurs, rubs or gallops. Carotid upstroke normal, no bruit. Normal abdominal aortic size, no bruits. Femorals normal pulses, no bruits. Pedals normal pulses. No edema, no varicosities. Abdomen:  Bowel sounds positive; abdomen soft and non-tender without masses, organomegaly, or hernias noted. No hepatosplenomegaly. Msk:  Back normal, normal gait. Muscle strength and tone normal. Pulses:  pulses normal in all 4 extremities Extremities:  No clubbing or cyanosis. Neurologic:  Alert and oriented x 3.  CNII-XII intact, strength/ sensation intact   EKG  Procedure date:  08/12/2009  Findings:      sinus rhythm 70 bpm,  PR 240, LAD  Impression & Recommendations:  Problem # 1:  PAROXYSMAL ATRIAL FIBRILLATION (ICD-427.31)  The following medications were removed from the medication list:    Flecainide Acetate 100 Mg Tabs (Flecainide acetate) .Marland Kitchen... Take one tablet by mouth every 12 hours His updated medication list for this problem includes:    Warfarin Sodium 5 Mg Tabs  (Warfarin sodium) .Marland Kitchen... Take as directed by coumadin clinic.  The following medications were removed from the medication list:    Flecainide Acetate 100 Mg Tabs (Flecainide acetate) .Marland Kitchen... Take one tablet by mouth every 12 hours His updated medication list for this problem includes:    Warfarin Sodium 5 Mg Tabs (Warfarin sodium) .Marland Kitchen... Take as directed by coumadin clinic.  Problem # 2:  HYPERTENSION (ICD-401.9) above goal we will increase altace to 5mg  daily. He will f/u with PCP in the next few weeks for BMET. He should consider increasing altace to 10mg  at that time if BP remains elevated  Problem # 3:  GERD (ICD-530.81) Per pts request, he will stop PPI today I have recommended that he contact his GI doctor for follow-up given his h/o Barrett's esophagus and malginancy  Problem # 4:  DYSLIPIDEMIA (ICD-272.4)  stable  His updated medication list for this problem includes:    Crestor 10 Mg Tabs (Rosuvastatin calcium) ..... One tab tues. and fri.  Patient Instructions: 1)  Your physician recommends that you schedule a follow-up appointment in: 3 months with Dr Johney Frame 2)  Your physician has recommended you make the following change in your medication: increase Altace to 5mg  daily, stop Flecainide, decrease Prilosec  to 1/2 tab. daily for 1 week and check with GI doctor Prescriptions: ALTACE 5 MG CAPS (RAMIPRIL) Take one capsule  by mouth once daily.  #30 x 6   Entered by:   Laurance Flatten CMA   Authorized by:   Hillis Range, MD   Signed by:   Laurance Flatten CMA on 08/12/2009   Method used:   Electronically to        [facility] (retail)       [address]       [place], [place]  [address]       Ph: [phone]       Fax: [phone]   RxID:   [id number]

## 2010-06-06 NOTE — Medication Information (Signed)
Summary: rov/tm  Anticoagulant Therapy  Managed by: Charolotte Eke, PharmD Referring MD: Dr. Johney Frame PCP: Dr Audie Clear MD: Ladona Ridgel MD, Sharlot Gowda Indication 1: Atrial Fibrillation Lab Used: [facility] [place] Site: [address] INR POC 2.5 INR RANGE 2.0-3.0  Dietary changes: no    Health status changes: no    Bleeding/hemorrhagic complications: no    Recent/future hospitalizations: no    Any changes in medication regimen? no    Recent/future dental: no  Any missed doses?: no       Is patient compliant with meds? yes       Current Medications (verified): 1)  Altace 2.5 Mg Caps (Ramipril) .... Take 1 By Mouth Once Daily 2)  Allopurinol 300 Mg Tabs (Allopurinol) .... 1/2 Once Daily 3)  Prevision Vit .... Two Times A Day 4)  Warfarin Sodium 2.5 Mg Tabs (Warfarin Sodium) .... 6 Days A Week and 5mg  1 Day 5)  Crestor 10 Mg Tabs (Rosuvastatin Calcium) .... One Tab Tues. and Fri. 6)  Prilosec 20 Mg Cpdr (Omeprazole) .... Once Daily 7)  Flecainide Acetate 100 Mg Tabs (Flecainide Acetate) .... Take One Tablet By Mouth Every 12 Hours  Allergies (verified): No Known Drug Allergies  Anticoagulation Management History:      The patient is taking warfarin and comes in today for a routine follow up visit.  Positive risk factors for bleeding include an age of 75 years or older and presence of serious comorbidities.  The bleeding index is 'intermediate risk'.  Positive CHADS2 values include History of HTN and Age > 24 years old.  His last INR was 3.2 ratio.  Anticoagulation responsible provider: Ladona Ridgel MD, Sharlot Gowda.  INR POC: 2.5.  Cuvette Lot#: [id number].  Exp: 08/2010.    Anticoagulation Management Assessment/Plan:      The patient's current anticoagulation dose is Warfarin sodium 2.5 mg tabs: 6 days a week and 5mg  1 day.  The next INR is due 06/16/2009.  Anticoagulation instructions were given to patient.  Results were reviewed/authorized by Charolotte Eke, PharmD.  He was  notified by Charolotte Eke, PharmD.         Prior Anticoagulation Instructions: INR 2.9 Continue 2.5mg s daily except 5mg s on Mondays, Wednesdays and Fridays. Recheck in 7-10 days.   Current Anticoagulation Instructions: The patient is to continue with the same dose of coumadin.  This dosage includes:

## 2010-06-06 NOTE — Medication Information (Signed)
Summary: rov/ewj  Anticoagulant Therapy  Managed by: Weston Brass, PharmD Referring MD: Dr. Johney Frame PCP: Dr Audie Clear MD: Shirlee Latch MD, Freida Busman Indication 1: Atrial Fibrillation Lab Used: [facility] [place] Site: [address] INR POC 2.4 INR RANGE 2.0-3.0  Dietary changes: no    Health status changes: no    Bleeding/hemorrhagic complications: no    Recent/future hospitalizations: no    Any changes in medication regimen? no    Recent/future dental: no  Any missed doses?: no       Is patient compliant with meds? yes       Allergies: No Known Drug Allergies  Anticoagulation Management History:      The patient is taking warfarin and comes in today for a routine follow up visit.  Positive risk factors for bleeding include an age of 75 years or older and presence of serious comorbidities.  The bleeding index is 'intermediate risk'.  Positive CHADS2 values include History of HTN and Age > 83 years old.  His last INR was 3.2 ratio.  Anticoagulation responsible provider: Shirlee Latch MD, Zorian Gunderman.  INR POC: 2.4.  Cuvette Lot#: [id number].  Exp: 01/2011.    Anticoagulation Management Assessment/Plan:      The patient's current anticoagulation dose is Warfarin sodium 5 mg tabs: Take as directed by coumadin clinic..  The target INR is 2.0-3.0.  The next INR is due 12/12/2009.  Anticoagulation instructions were given to patient.  Results were reviewed/authorized by Weston Brass, PharmD.  He was notified by Dillard Cannon.         Prior Anticoagulation Instructions: INR 2.4  Continue on same dosage 1/2 tablet daily except 1 tablet on Mondays, Wednesdays and Fridays.  Recheck in 4 weeks.    Current Anticoagulation Instructions: INR 2.2  Continue same regimen of 0.5 tab on Sunday, Tuesday, Thursday, and Saturday and 1 tab on Monday, Wednesday, and Friday.  Re-check INR in 4 weeks.

## 2010-06-06 NOTE — Medication Information (Signed)
Summary: rov.mp  Anticoagulant Therapy  Managed by: Eda Keys, PharmD Referring MD: Dr. Johney Frame PCP: Dr Audie Clear MD: Myrtis Ser MD, Tinnie Gens Indication 1: Atrial Fibrillation Lab Used: [facility]  Site: [address] INR POC 2.2 INR RANGE 2.0-3.0  Dietary changes: no    Health status changes: no    Bleeding/hemorrhagic complications: no    Recent/future hospitalizations: no    Any changes in medication regimen? no    Recent/future dental: no  Any missed doses?: no       Is patient compliant with meds? yes       Allergies: No Known Drug Allergies  Anticoagulation Management History:      The patient is taking warfarin and comes in today for a routine follow up visit.  Positive risk factors for bleeding include an age of 75 years or older and presence of serious comorbidities.  The bleeding index is 'intermediate risk'.  Positive CHADS2 values include History of HTN and Age > 83 years old.  His last INR was 3.2 ratio.  Anticoagulation responsible provider: Myrtis Ser MD, Tinnie Gens.  INR POC: 2.2.  Cuvette Lot#: [id number].  Exp: 09/2010.    Anticoagulation Management Assessment/Plan:      The patient's current anticoagulation dose is Warfarin sodium 5 mg tabs: Take as directed by coumadin clinic..  The next INR is due 07/14/2009.  Anticoagulation instructions were given to patient.  Results were reviewed/authorized by Eda Keys, PharmD.  He was notified by Eda Keys.         Prior Anticoagulation Instructions: INR 2.6  Continue 1 tab each Monday, Wednesday, Friday. Continue 0.5 tab on all other days.  Recheck in 4 weeks.   Current Anticoagulation Instructions: INR 2.2  Continue current dosing schedule of 1 tablet on Monday, Wednesday, and Friday and 1/2 tablet all other days. Please notify us if Dr. Johney Frame instructs you to remain on coumadin.

## 2010-06-06 NOTE — Medication Information (Signed)
Summary: rov  js  Anticoagulant Therapy  Managed by: Bethena Midget, RN, BSN Referring MD: Dr. Johney Frame PCP: Dr Evlyn Kanner Supervising MD: Jens Som MD, Arlys John Indication 1: Atrial Fibrillation Lab Used: [facility] [place] Site: [address] INR POC 2.9 INR RANGE 2.0-3.0  Dietary changes: no    Health status changes: no    Bleeding/hemorrhagic complications: no    Recent/future hospitalizations: no    Any changes in medication regimen? no    Recent/future dental: no  Any missed doses?: no       Is patient compliant with meds? yes      Comments: Pending Ablation tomorrow with Dr. Johney Frame  Allergies: No Known Drug Allergies  Anticoagulation Management History:      The patient is taking warfarin and comes in today for a routine follow up visit.  Positive risk factors for bleeding include an age of 75 years or older and presence of serious comorbidities.  The bleeding index is 'intermediate risk'.  Positive CHADS2 values include History of HTN and Age > 24 years old.  His last INR was 3.2 ratio.  Anticoagulation responsible provider: Jens Som MD, Arlys John.  INR POC: 2.9.  Cuvette Lot#: [id number].  Exp: 06/2010.    Anticoagulation Management Assessment/Plan:      The patient's current anticoagulation dose is Warfarin sodium 2.5 mg tabs: 6 days a week and 5mg  1 day.  The next INR is due 05/19/2009.  Anticoagulation instructions were given to patient.  Results were reviewed/authorized by Bethena Midget, RN, BSN.  He was notified by Bethena Midget, RN, BSN.         Prior Anticoagulation Instructions: INR = 3.2  Hold today's dose then resume 1/2 tablet every day except 1 tablet on Mondays, Wednesdays, and Fridays.    Current Anticoagulation Instructions: INR 2.9 Continue 2.5mg s daily except 5mg s on Mondays, Wednesdays and Fridays. Recheck in 7-10 days.

## 2010-06-06 NOTE — Medication Information (Signed)
Summary: rov/tm  Anticoagulant Therapy  Managed by: Eda Keys, PharmD Referring MD: Dr. Johney Frame PCP: Dr Audie Clear MD: Jens Som MD, Arlys John Indication 1: Atrial Fibrillation Lab Used: [facility] [place] Site: [address] INR POC 2.3 INR RANGE 2.0-3.0  Dietary changes: no    Health status changes: no    Bleeding/hemorrhagic complications: no    Recent/future hospitalizations: no    Any changes in medication regimen? no    Recent/future dental: no  Any missed doses?: no       Is patient compliant with meds? yes       Allergies: No Known Drug Allergies  Anticoagulation Management History:      The patient is taking warfarin and comes in today for a routine follow up visit.  Positive risk factors for bleeding include an age of 75 years or older and presence of serious comorbidities.  The bleeding index is 'intermediate risk'.  Positive CHADS2 values include History of HTN and Age > 75 years old.  His last INR was 3.2 ratio.  Anticoagulation responsible provider: Jens Som MD, Arlys John.  INR POC: 2.3.  Cuvette Lot#: [id number].  Exp: 10/2010.    Anticoagulation Management Assessment/Plan:      The patient's current anticoagulation dose is Warfarin sodium 5 mg tabs: Take as directed by coumadin clinic..  The next INR is due 10/07/2009.  Anticoagulation instructions were given to patient.  Results were reviewed/authorized by Eda Keys, PharmD.  He was notified by Eda Keys.         Prior Anticoagulation Instructions: INR 1.9 Today take 1 1/2 pills then resume 1/2 pill everyday except  1 pill on Mondays, Wednesdays and Fridays. Recheck in 4 weeks.   Current Anticoagulation Instructions: INR 2.3  Continue taking 1 tablet on Monday, Wednesday, and Friday, and 1/2 tablet all other days.  Return to clinic in 4 weeks.

## 2010-06-06 NOTE — Letter (Signed)
Summary: Handout Printed  Printed Handout:  - Coumadin Instructions-w/out Meds 

## 2010-06-06 NOTE — Medication Information (Signed)
Summary: rov/sp  Anticoagulant Therapy  Managed by: Bethena Midget, RN, BSN Referring MD: Dr. Johney Frame PCP: Dr Evlyn Kanner Supervising MD: Daleen Squibb MD, Maisie Fus Indication 1: Atrial Fibrillation Lab Used: [facility] [place] Site: [address] INR POC 1.9 INR RANGE 2.0-3.0  Dietary changes: no    Health status changes: no    Bleeding/hemorrhagic complications: no    Recent/future hospitalizations: no    Any changes in medication regimen? no    Recent/future dental: no  Any missed doses?: no       Is patient compliant with meds? yes      Comments: Saw Dr. Johney Frame today, will remain on coumadin for another 3 mth, has follow up appt. in July with him.   Allergies (verified): No Known Drug Allergies  Anticoagulation Management History:      The patient is taking warfarin and comes in today for a routine follow up visit.  Positive risk factors for bleeding include an age of 15 years or older and presence of serious comorbidities.  The bleeding index is 'intermediate risk'.  Positive CHADS2 values include History of HTN and Age > 69 years old.  His last INR was 3.2 ratio.  Anticoagulation responsible provider: Daleen Squibb MD, Maisie Fus.  INR POC: 1.9.  Cuvette Lot#: [id number].  Exp: 09/2010.    Anticoagulation Management Assessment/Plan:      The patient's current anticoagulation dose is Warfarin sodium 5 mg tabs: Take as directed by coumadin clinic..  The next INR is due 09/09/2009.  Anticoagulation instructions were given to patient.  Results were reviewed/authorized by Bethena Midget, RN, BSN.  He was notified by Bethena Midget, RN, BSN.         Prior Anticoagulation Instructions: INR 2.2  Continue current dosing schedule of 1 tablet on Monday, Wednesday, and Friday and 1/2 tablet all other days. Please notify us if Dr. Johney Frame instructs you to remain on coumadin.     Current Anticoagulation Instructions: INR 1.9 Today take 1 1/2 pills then resume 1/2 pill everyday except  1 pill on  Mondays, Wednesdays and Fridays. Recheck in 4 weeks.

## 2010-06-06 NOTE — Medication Information (Signed)
Summary: rov-tp  Anticoagulant Therapy  Managed by: Shelby Dubin, PharmD, BCPS, CPP Referring MD: Dr. Johney Frame PCP: Dr Audie Clear MD: Ladona Ridgel MD, Sharlot Gowda Indication 1: Atrial Fibrillation Lab Used: [facility] [place] Site: [address] INR POC 2.6 INR RANGE 2.0-3.0  Dietary changes: no    Health status changes: no    Bleeding/hemorrhagic complications: no    Recent/future hospitalizations: no    Any changes in medication regimen? no    Recent/future dental: no  Any missed doses?: no       Is patient compliant with meds? yes       Current Medications (verified): 1)  Altace 2.5 Mg Caps (Ramipril) .... Take 1 By Mouth Once Daily 2)  Allopurinol 300 Mg Tabs (Allopurinol) .... 1/2 Once Daily 3)  Prevision Vit .... Two Times A Day 4)  Warfarin Sodium 5 Mg Tabs (Warfarin Sodium) .... Take As Directed By Coumadin Clinic. 5)  Crestor 10 Mg Tabs (Rosuvastatin Calcium) .... One Tab Tues. and Fri. 6)  Prilosec 20 Mg Cpdr (Omeprazole) .... Once Daily 7)  Flecainide Acetate 100 Mg Tabs (Flecainide Acetate) .... Take One Tablet By Mouth Every 12 Hours  Allergies (verified): No Known Drug Allergies  Anticoagulation Management History:      The patient is taking warfarin and comes in today for a routine follow up visit.  Positive risk factors for bleeding include an age of 75 years or older and presence of serious comorbidities.  The bleeding index is 'intermediate risk'.  Positive CHADS2 values include History of HTN and Age > 53 years old.  His last INR was 3.2 ratio.  Anticoagulation responsible provider: Ladona Ridgel MD, Sharlot Gowda.  INR POC: 2.6.  Cuvette Lot#: [id number].  Exp: 08/2010.    Anticoagulation Management Assessment/Plan:      The patient's current anticoagulation dose is Warfarin sodium 5 mg tabs: Take as directed by coumadin clinic..  The next INR is due 07/14/2009.  Anticoagulation instructions were given to patient.  Results were reviewed/authorized by Shelby Dubin, PharmD, BCPS, CPP.  He was notified by Shelby Dubin PharmD, BCPS, CPP.         Prior Anticoagulation Instructions: The patient is to continue with the same dose of coumadin.  This dosage includes:   Current Anticoagulation Instructions: INR 2.6  Continue 1 tab each Monday, Wednesday, Friday. Continue 0.5 tab on all other days.  Recheck in 4 weeks.

## 2010-06-06 NOTE — Medication Information (Signed)
Summary: rov/eac  Anticoagulant Therapy  Managed by: Cloyde Reams, RN, BSN Referring MD: Dr. Johney Frame PCP: Dr Audie Clear MD: Eden Emms MD, Theron Arista Indication 1: Atrial Fibrillation Lab Used: [facility] [place] Site: [address] INR POC 2.4 INR RANGE 2.0-3.0  Dietary changes: no    Health status changes: no    Bleeding/hemorrhagic complications: no    Recent/future hospitalizations: no    Any changes in medication regimen? no    Recent/future dental: no  Any missed doses?: no       Is patient compliant with meds? yes       Allergies (verified): No Known Drug Allergies  Anticoagulation Management History:      The patient is taking warfarin and comes in today for a routine follow up visit.  Positive risk factors for bleeding include an age of 75 years or older and presence of serious comorbidities.  The bleeding index is 'intermediate risk'.  Positive CHADS2 values include History of HTN and Age > 78 years old.  His last INR was 3.2 ratio.  Anticoagulation responsible provider: Eden Emms MD, Theron Arista.  INR POC: 2.4.  Cuvette Lot#: [id number].  Exp: 12/2010.    Anticoagulation Management Assessment/Plan:      The patient's current anticoagulation dose is Warfarin sodium 5 mg tabs: Take as directed by coumadin clinic..  The next INR is due 11/14/2009.  Anticoagulation instructions were given to patient.  Results were reviewed/authorized by Cloyde Reams, RN, BSN.  He was notified by Cloyde Reams RN.         Prior Anticoagulation Instructions: INR 2.3  Continue taking 1 tablet on Monday, Wednesday, and Friday, and 1/2 tablet all other days.  Return to clinic in 4 weeks.  Current Anticoagulation Instructions: INR 2.4  Continue on same dosage 1/2 tablet daily except 1 tablet on Mondays, Wednesdays and Fridays.  Recheck in 4 weeks.

## 2010-06-08 NOTE — Progress Notes (Signed)
Summary: did you get labwork  Phone Note Call from Patient Call back at Home Phone [phone]   Caller: Patient Reason for Call: Talk to Nurse, Talk to Doctor Summary of Call: pt went to get bmp done as requested and if you dont have a cpoy he will be glad to send you one Initial call taken by: Omer Jack,  May 24, 2010 1:19 PM  Follow-up for Phone Call        have not gotten Dennis Bast, RN, BSN  May 24, 2010 2:35 PM

## 2010-06-09 NOTE — Medication Information (Signed)
Summary: [facility]madin Clinic  [facility]   Imported By: Kassie Mends 06/30/2009 10:54:56  _____________________________________________________________________  External Attachment:    Type:   Image     Comment:   External Document

## 2010-07-23 LAB — PROTIME-INR
INR: 2.55 — ABNORMAL HIGH (ref 0.00–1.49)
INR: 2.69 — ABNORMAL HIGH (ref 0.00–1.49)
Prothrombin Time: 27.2 seconds — ABNORMAL HIGH (ref 11.6–15.2)

## 2010-07-23 LAB — APTT: aPTT: 42 seconds — ABNORMAL HIGH (ref 24–37)

## 2010-08-13 LAB — PROTIME-INR
INR: 3.2 — ABNORMAL HIGH (ref 0.00–1.49)
Prothrombin Time: 29.7 seconds — ABNORMAL HIGH (ref 11.6–15.2)

## 2010-08-13 LAB — BASIC METABOLIC PANEL
BUN: 20 mg/dL (ref 6–23)
CO2: 28 mEq/L (ref 19–32)
GFR calc non Af Amer: 38 mL/min — ABNORMAL LOW (ref >60)
Glucose, Bld: 111 mg/dL — ABNORMAL HIGH (ref 70–99)
Potassium: 4.7 mEq/L (ref 3.5–5.1)
Sodium: 137 mEq/L (ref 135–145)

## 2010-08-13 LAB — CK TOTAL AND CKMB (NOT AT ARMC)
CK, MB: 1.1 ng/mL (ref 0.3–4.0)
CK, MB: 1.4 ng/mL (ref 0.3–4.0)
Total CK: 43 U/L (ref 7–232)
Total CK: 58 U/L (ref 7–232)

## 2010-08-13 LAB — CBC
HCT: 37.2 % — ABNORMAL LOW (ref 39.0–52.0)
HCT: 43.2 % (ref 39.0–52.0)
Hemoglobin: 12.8 g/dL — ABNORMAL LOW (ref 13.0–17.0)
MCHC: 35.1 g/dL (ref 30.0–36.0)
MCV: 95.4 fL (ref 78.0–100.0)
RBC: 3.89 MIL/uL — ABNORMAL LOW (ref 4.22–5.81)
RDW: 13 % (ref 11.5–15.5)
WBC: 6.4 10*3/uL (ref 4.0–10.5)

## 2010-08-13 LAB — DIFFERENTIAL
Basophils Absolute: 0 10*3/uL (ref 0.0–0.1)
Lymphocytes Relative: 17 % (ref 12–46)
Monocytes Relative: 8 % (ref 3–12)
Neutro Abs: 6.7 10*3/uL (ref 1.7–7.7)

## 2010-08-13 LAB — POCT CARDIAC MARKERS
CKMB, poc: <1 ng/mL — ABNORMAL LOW (ref 1.0–8.0)
Troponin i, poc: <0.05 ng/mL (ref 0.00–0.09)

## 2010-08-13 LAB — CARDIAC PANEL(CRET KIN+CKTOT+MB+TROPI)
CK, MB: 0.9 ng/mL (ref 0.3–4.0)
Total CK: 40 U/L (ref 7–232)
Total CK: 41 U/L (ref 7–232)

## 2010-08-13 LAB — LIPID PANEL
Cholesterol: 125 mg/dL (ref 0–200)
HDL: 30 mg/dL — ABNORMAL LOW (ref >39)
LDL Cholesterol: 75 mg/dL (ref 0–99)
Total CHOL/HDL Ratio: 4.2 RATIO
Triglycerides: 98 mg/dL (ref <150)

## 2010-08-13 LAB — TSH: TSH: 1.105 u[IU]/mL (ref 0.350–4.500)

## 2010-08-13 LAB — POCT I-STAT, CHEM 8
BUN: 22 mg/dL (ref 6–23)
Chloride: 106 mEq/L (ref 96–112)
Creatinine, Ser: 1.8 mg/dL — ABNORMAL HIGH (ref 0.4–1.5)
Glucose, Bld: 114 mg/dL — ABNORMAL HIGH (ref 70–99)
Hemoglobin: 15.3 g/dL (ref 13.0–17.0)
Potassium: 4.4 mEq/L (ref 3.5–5.1)

## 2010-08-13 LAB — AMYLASE: Amylase: 66 U/L (ref 27–131)

## 2010-08-13 LAB — TROPONIN I: Troponin I: 0.02 ng/mL (ref 0.00–0.06)

## 2010-08-14 LAB — PROTIME-INR: INR: 2.2 — ABNORMAL HIGH (ref 0.00–1.49)

## 2010-08-31 ENCOUNTER — Encounter: Payer: Self-pay | Admitting: Internal Medicine

## 2010-09-19 NOTE — Op Note (Signed)
Carlos Webster, Carlos Webster                ACCOUNT NO.:  [id number]   MEDICAL RECORD NO.:  [id number]          PATIENT TYPE:  INP   LOCATION:  2030                         FACILITY:  [facility]   PHYSICIAN:  Hillis Range, MD       DATE OF BIRTH:  1927-10-22   DATE OF PROCEDURE:  DATE OF DISCHARGE:                               OPERATIVE REPORT   PREPROCEDURE DIAGNOSIS:  Paroxysmal atrial fibrillation.   POSTPROCEDURE DIAGNOSES:  1. Paroxysmal atrial fibrillation.  2. 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  3. 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  4. Patent foramen ovale.   PROCEDURES:  1. Comprehensive electrophysiologic study.  2. Coronary sinus pacing and recording.  3. Intracardiac echocardiography.  4. Arterial blood pressure monitoring.  5. Left femoral venous venography.  6. Aborted atrial fibrillation ablation.  7. Attempted transseptal puncture.   DESCRIPTION OF PROCEDURE:  Informed written consent was obtained and the  patient was brought to the electrophysiology lab in the fasting state.  He was adequately sedated with intravenous medications as outlined in  the anesthesia report.  The patient's right and left groins were prepped  and draped in the usual sterile fashion by the EP lab staff.  Using a  percutaneous Seldinger technique, one 6-French, one 7-French and one 8-  [place] hemostasis sheaths were placed into the right common femoral  vein.  An 11-French hemostasis sheath was placed into the left common  femoral vein.  A 4-French hemostasis sheath was placed into right common  femoral artery for blood pressure monitoring.  An 11-French intracardiac  echocardiography catheter was advanced through the left common femoral  vein, but could not be adequately advanced to pass the left iliac vein.  I therefore performed a venogram of the left common femoral vein with  injection of 10 mL of nonionic contrast through the  femoral vein sheath.  This revealed moderate stenosis within the left common iliac vein with  several collateral vessels.  The intracardiac echocardiography catheter  was therefore removed and no catheters were advanced through this vein.  A 6-French decapolar Polaris X catheter was introduced through the right  common femoral vein and advanced into the coronary sinus.  The patient  was noted to have a markedly and anteriorly displaced coronary sinus  with a large ostium.  The [employer] SoundStar intracardiac  echocardiography catheter was introduced through the right common  femoral vein and advanced into the right atrium.  Three-dimensional  intracardiac echocardiography was performed using CARTOSOUND technology.  A reconstruction of the left atrium and the pulmonary veins was  performed using CARTOSOUND technology.  This demonstrated moderate-sized  pulmonary veins with no evidence of pulmonary vein stenosis.  The  patient was noted to have a very small patent foramen ovale.  The  interatrial septum was noted to be very anteriorly displaced.  A 6-  French quadripolar Josephson catheter was introduced through the right  common femoral vein and advanced into the right ventricle for recording  and pacing.  This catheter was then  pulled back to the His bundle  location.  The patient presented to the electrophysiology lab in normal  sinus rhythm.  His RR interval measured 1073 milliseconds with a PR  interval of 215 milliseconds, QRS duration of 102 milliseconds, and QT  interval of 468 milliseconds.  The AH interval measured 119 milliseconds  with an HV interval of 42 milliseconds.  Ventricular pacing was  performed, which revealed midline decremental VA conduction with a VA  Wenckebach cycle length of greater than 700 milliseconds.  Atrial pacing  was then performed, which revealed no evidence of PR greater than RR.  The AV Wenckebach cycle length was 590 milliseconds and no  tachycardias  were induced with rapid atrial pacing.  The His bundle catheter was then  removed.  Incidentally, the His electrogram was noted to be quite  anterior in its position.  The middle common femoral vein sheath was  exchanged for an 8.5-French SL2 transseptal sheath.  Transseptal access  was attempted with fluoroscopic visualization multiple times with  intracardiac echocardiography guidance.  The patient, however, was noted  to have a very anterior fossa ovalis and adequate transseptal access  could not be achieved.  It was therefore felt prudent to not perform  ablation and no further attempts for transseptal access were made.  Though the patient was noted to have a patent foramen ovale, a wire  could not be advanced across this.  Intracardiac echocardiography was  again used to evaluate for pericardial effusion and there was none.  The  patient remained hemodynamically stable.  The procedure was therefore  discontinued.  All catheters were removed and the sheaths were aspirated  and flushed.  The sheaths were removed and hemostasis was assured.  There were no early apparent complications.  A limited bedside  transthoracic echocardiogram revealed no pericardial effusion.   CONCLUSIONS:  1. Sinus rhythm upon presentation.  2. Moderate stenosis within the left common iliac vein with difficulty      passing catheters through this vein.  3. The patient was noted to have a very anterior coronary sinus ostium      as well as an anterior located fossa ovalis.  4. Patent foramen ovale.  5. Inability to achieve left atrial access for catheter ablation.  6. No catheter ablation was performed.  7. Early apparent complications.      Hillis Range, MD  Electronically Signed     JA/MEDQ  D:  10/29/2008  T:  10/30/2008  Job:  [id number]   cc:   Madaline Savage, M.D.

## 2010-09-19 NOTE — Discharge Summary (Signed)
Carlos Webster, Carlos Webster                ACCOUNT NO.:  [id number]   MEDICAL RECORD NO.:  [id number]          PATIENT TYPE:  INP   LOCATION:  2030                         FACILITY:  [facility]   PHYSICIAN:  Hillis Range, MD       DATE OF BIRTH:  1927/07/17   DATE OF ADMISSION:  10/29/2008  DATE OF DISCHARGE:  10/30/2008                               DISCHARGE SUMMARY   This patient has no known drug allergies.   Time for this dictation greater than 40 minutes.   FINAL DIAGNOSIS:  1. Discharging day #1, status post electrophysiology study with      attempted ablation of paroxysmal atrial fibrillation.      a.     Aborted procedure, secondary to inability to cross the       septum adequately into the left atrium.  The patient is discharged       on his pre-hospitalization medications.   SECONDARY DIAGNOSES:  1. Hypertension.  2. Chronic anticoagulation.  3. Gastroesophageal reflux disease.  4. History of esophageal cancer, status post resection in 1996.  5. Dyslipidemia.  6. Bilateral inguinal herniorrhaphies in 1987 and 1996.   PROCEDURE:  On October 29, 2008, electrophysiology study, comprehensive,  coronary sinus pacing and recording, intracardiac echocardiography,  arterial blood pressure mapping, left femoral vein venogram.   There is a small PFO.  The pulmonary veins are moderate in size.  The  coronary sinus ostium is large, but very anteriorly displaced.  The  interatrial septum was also very anteriorly displaced secondary to  esophageal resection.  Adequate transseptal access could not be achieved  despite multiple attempts.  Procedure was aborted and no ablation  attempted.  The patient once again will be discharged on pre-procedure  medications.   BRIEF HISTORY:  Carlos Webster is a 77 year old male.  He has a history of  paroxysmal atrial fibrillation.  He is referred to Dr. Johney Frame by Dr.  Elsie Lincoln for a consultation regarding atrial fibrillation.   He was diagnosed with atrial  fibrillation 3-4 years ago.  He suspects,  however, that he has had atrial fibrillation since 2000.  He reports  symptoms of palpitations, fatigue, decreased energy, and decreased  exercise tolerance.  He was started on amiodarone 2-1/2 years ago.  This  produced intention tremor.  It did control his atrial fibrillation well.  He was subsequently started on Multaq, but has had recurrent atrial  fibrillation with this medication.  This has been discontinued several  weeks ago and now he is on flecainide and apparently has had no  breakthrough tachy arrhythmias on this medication.  The methodology of  atrial fibrillation ablation has been discussed with the patient.  The  risks and benefits are thoroughly gone over and the patient wishes to  proceed.   HOSPITAL COURSE:  The patient presents electively on October 29, 2008.  As  stated above, a comprehensive electrophysiology study was performed.  Unfortunately, the catheter could not cross the atrial septum to achieve  a left atrial approach due to anterior displacement, probably secondary  to past surgery for resection of esophageal  carcinoma.  After 3 hours,  the procedure was aborted.  The patient can maintain on his preoperative  medications and will be discharged in the morning of October 30, 2008.   DISCHARGE MEDICATIONS:  1. Flecainide 100 mg twice daily.  2. Ramipril 2.5 mg daily.  3. Pravastatin 20 mg on Monday, Wednesday, and Friday.  4. Allopurinol 150 mg daily.  5. Zantac 150 mg daily.  6. Multivitamin daily.  7. Fish oil caps daily.  8. Coumadin 5 mg on Monday and Friday and 2.5 mg on all other days.   He follows up with [facility], Dr. Johney Frame on Friday, November 19, 2008, at 3:15.   LABORATORY DATA:  Pertinent to this admission were drawn on October 26, 2008, white cells 6.4, hemoglobin 15.6, hematocrit 45, and platelets of  174.  Serum glucose 151, BUN is 21, sodium 136, potassium 4.3, chloride  102, and carbonate 26.   The patient's protime was 4.1, this was then  corrected and later protime on October 26, 2008, was 30.8, INR 2.4.  Of  note, the patient did have slight ooze from his cath site.  His INR at  the time of admission on October 29, 2008, was 2.2.  I think the pressure  should do to resolve this ooze.      Maple Mirza, [place]      Hillis Range, MD  Electronically Signed    GM/MEDQ  D:  10/29/2008  T:  10/30/2008  Job:  [id number]   cc:   Madaline Savage, M.D.

## 2010-09-22 NOTE — Discharge Summary (Signed)
Carlos Webster, Carlos Webster                ACCOUNT NO.:  [id number]   MEDICAL RECORD NO.:  [id number]          PATIENT TYPE:  INP   LOCATION:  2924                         FACILITY:  [facility]   PHYSICIAN:  Antonieta Iba, MD   DATE OF BIRTH:  03-Aug-1927   DATE OF ADMISSION:  11/08/2008  DATE OF DISCHARGE:  11/09/2008                               DISCHARGE SUMMARY   DISCHARGE DIAGNOSES:  1. Chest pain, negative myocardial infarction.  2. Atrial fibrillation, maintain sinus rhythm at discharge.  3. Hypertension.  4. History of Barrett esophagus.  5. History of esophageal cancer in the past remotely.  6. History of paroxysmal atrial fibrillation and attempted atrial      fibrillation ablation by Dr. Johney Frame, unable to complete.   DISCHARGE CONDITION:  Improved.   DISCHARGE MEDICATIONS:  1. Allopurinol 150 mg daily.  2. Enteric-coated aspirin 325 mg daily.  3. Tambocor 100 mg twice a day.  4. Lopressor 25 mg twice a day.  5. Multivitamin daily.  6. Omega-3 fatty acid, i.e. Lovaza one daily.  7. Protonix 40 mg daily.  8. Ramipril 2.5 mg daily.  9. Coumadin 5 mg daily.   DISCHARGE CONDITION:  Stable.   PROCEDURES:  None.   DISCHARGE INSTRUCTIONS:  1. You will be scheduled for a Persantine Myoview at [facility].  The office will call with date and time.  2. You will be followed up by Dr. Mariah Milling in the office.  Again, this      will be called to at home.  3. You will need Coumadin Clinic evaluation.  They will call you with      date and time.  4. You will follow up with Dr. Johney Frame once nuclear stress test was      completed as before.   HISTORY OF PRESENT ILLNESS:  A 77 year old white married male with a  history of PAF, negative stress test 1 year ago.  It was an exercise  stress and his heart rate was not adequate to exclude ischemia  completely.   The patient presented to the emergency room on November 08, 2008 with  complaints of chest pain that had  awakened him at 3 a.m.   The patient had gone into atrial fibrillation the day prior to  admission.  He was aware, he was in atrial fibrillation, but then he was  awakened at 3 a.m. with midsternal dull pressure different than his  usual chest awareness as a flutter and AFib.  No diaphoresis, mild  shortness of breath.  Some indigestion feeling, but no nausea.  Came in  the ER at [facility], nitroglycerin was given x1 without release.  He  was then given morphine without relief and GI cocktail with mild relief.   His history was complicated by esophageal cancer 14 years ago with  surgery and he also with Barrett esophagus.  He also had esophageal  dilatation in 2007.  He previously had been on Multaq, had stopped  secondary to weight loss.  The patient denies any problems with  swallowing currently.  Pain continues in his chest.  IV nitroglycerin  was added as well as IV Protonix.   Recently, the patient had been evaluated by Dr. Johney Frame, EP with [place]  for AFib ablation, but unfortunately due to his anatomy, they were  unable to do an ablation, so the procedure was aborted.   The patient currently on flecainide.   PAST MEDICAL HISTORY:  Negative stress test for ischemia prior to this  admission.  Esophageal cancer as stated.  He has had nephrolithiasis,  gout, chronic renal insufficiency with baseline creatinine of 1.6.  He  has had bilateral inguinal hernia repair as well.   ALLERGIES:  No known allergies.   PHYSICAL EXAMINATION:  VITAL SIGNS:  At discharge, blood pressure  122/60, pulse 57, respiratory rate 16, temperature 98.2, oxygen  saturation on room air is 98%.  HEART:  Regular rate and rhythm.  LUNGS:  Clear to auscultation bilaterally.  ABDOMEN:  Soft and nontender.  EXTREMITIES:  With no edema.   LABORATORY VALUES:  Hemoglobin 15.2, hematocrit 43.2, WBC 9.5, platelets  185, neutrophils 70, lymph 17, mono 8, eos 4, baso 0.  Protime 29.7, INR  of 2.6, PTT 56.  His  Coumadin was continued and at discharge, INR was  3.2.   Chemistry; sodium 138, potassium 4.4, chloride 106, CO2 of 28, glucose  111, BUN 20, creatinine 1.76, amylase 66, lipase 25, glycohemoglobin  5.5.   CKs range 58, 43-40, MBs negative at 0.9-1.4, troponin I 0.01-0.02.   Total cholesterol 125, triglycerides 98, HDL 30, LDL 75.   TSH 1.105.   EKG, atrial fibrillation on admission, rightward axis, but no acute ST  changes.  Chest x-ray on admission, cardiomegaly, mild right basal  atelectasis.   HOSPITAL COURSE:  The patient was admitted by Dr. Mariah Milling in atrial  fibrillation.  I discussed with Dr. Ladona Ridgel as EP was following him for  his AFib.  We left him on flecainide with increased dose x1 on the  morning.  We did not switch him to amiodarone unless he does definitely  has positive stress test that need cardiac catheterization.  Dr. Johney Frame  also saw him during hospitalization and felt to continue the Coumadin  100 b.i.d.  The patient did not want cardiac catheterization, so he  recommend an exercise Myoview, but with his medications, unable to stop  as he would be back in atrial fibrillation and even off those  medications, he could not ambulate well enough to get his heart rate up  to 85% predicted on his last stress test.  He will need a GI followup as  well as an outpatient as well as to see Dr. Evlyn Kanner, which he knew to  follow up with.   The patient was stable at time of discharge and ambulating in the hall  without problems and he will follow up as instructed.      Darcella Gasman. Annie Paras, N.P.      Antonieta Iba, MD  Electronically Signed    LRI/MEDQ  D:  12/02/2008  T:  12/03/2008  Job:  [id number]   cc:   Tera Mater. Evlyn Kanner, M.D.  Hillis Range, MD  Venita Lick. Russella Dar, MD, Clementeen Graham

## 2010-09-22 NOTE — Op Note (Signed)
NAME:  AMOGH, KOMATSU                          ACCOUNT NO.:  [id number]   MEDICAL RECORD NO.:  [id number]                   PATIENT TYPE:  AMB   LOCATION:  [facility]                                 FACILITY:  [facility]   PHYSICIAN:  Valetta Fuller, M.D.               DATE OF BIRTH:  [birth year]   DATE OF PROCEDURE:  DATE OF DISCHARGE:                                 OPERATIVE REPORT   PREOPERATIVE DIAGNOSIS:  Right renal pelvic stone, 16 mm.   POSTOPERATIVE DIAGNOSIS:  Right renal pelvic stone, 16 mm.   OPERATION/PROCEDURE:  1. Cystoscopy.  2. Right retrograde pyelography.  3. Right double-J stent placement.   SURGEON:  Valetta Fuller, M.D.   ANESTHESIA:  General.   INDICATIONS:  Mr. Pitera is a 77 year old male.  He has been followed for  some time with a rather large stone in the lower pole calyx of his right  kidney.  Recently followup imaging showed this stone had migrated to the  renal pelvis.  There did not appear to be much hydronephrosis and he was  relatively symptomatic but we felt that given the new location of the stone,  the treatment was indicated.  We recommended double-J stent placement  followed by lithotripsy.   DESCRIPTION OF PROCEDURE:  The patient was brought to the operating room  where he had successful induction of general anesthesia.  He was placed in  the lithotomy position and prepped and draped in the usual manner.  Cystoscopy revealed a relatively unremarkable prostate.  He did have minimal  lateral lobe tissue with a moderately prominent median bar.  Retrograde  pyelogram showed a normal caliber ureter without evidence of significant  obstruction.  There was a 1.5-2 cm filling defect in the right renal pelvis.  A guidewire was placed without difficulty up to the renal pelvis.  Over the  guidewire a 7-French 24 cm double-J stent was placed without difficulty.  The patient appeared to tolerate the procedure well.  There were no obvious   complications.                                               Valetta Fuller, M.D.    DSG/MEDQ  D:  08/16/2003  T:  08/16/2003  Job:  [id number]

## 2010-09-22 NOTE — Assessment & Plan Note (Signed)
[facility]                           GASTROENTEROLOGY OFFICE NOTE   NAME:Carlos Webster, Carlos Webster                       MRN:          [id number]  DATE:11/21/2005                            DOB:          1928/03/23    Mr. Carlos Webster returns with a history of about a 5- to 6-pound weight loss that  has been very gradual over the past 6 months.  He has noted an increase in  early satiety over the past 2 years.  He has intermittent difficulty with  dysphagia to meats, but these symptoms have not progressed substantially  over the past few years.  He was recently seen by Dr. Evlyn Kanner, who recommended  a return office visit with me.  He notes no abdominal pain, nausea,  vomiting, odynophagia, melena, hematochezia or change in stool caliber.  Colonoscopy was performed in October 2004, which showed diverticulosis and  internal hemorrhoids.  His last upper endoscopy was performed in April 2002,  and dilation was accomplished to 14 mm at his esophagectomy anastomosis.   CURRENT MEDICATIONS:  1.  Betapace 40 mg b.i.d.  2.  Altace 2.5 mg daily.  3.  Allopurinol 100 mg daily.  4.  Pravachol 1/2 tablet Monday, Wednesday and Friday.  5.  Coumadin 2.5 mg daily.  6.  Zantac 150 mg p.r.n.   MEDICATION ALLERGIES:  NONE KNOWN.   PHYSICAL EXAMINATION:  In no acute distress.  Weight 157 pounds, blood pressure is 122/76, pulse 62 and regular.  HEENT:  Anicteric sclerae.  Oropharynx clear.  NECK:  Without thyromegaly or adenopathy appreciated.  CHEST:  Clear to auscultation bilaterally.  CARDIAC:  Regular rate and rhythm without murmurs appreciated.  ABDOMEN:  Soft, nontender, nondistended.  Normoactive bowel sounds.  No  palpable organomegaly, masses or hernias.   ASSESSMENT AND PLAN:  Status post esophagectomy for adenocarcinoma, arising  in Barrett's esophagus.  Early satiety and mild dysphagia appear to be  chronic and stable, although he has a new mild weight loss.  Rule out  ulcer  disease and progression of his peptic stricture.  Risks, benefits and  alternatives to upper endoscopy with possible biopsy and possible dilation  performed under fluoroscopy were discussed with the patient, and he consents  to proceed.  This will be scheduled electively.  I would like to hold  Coumadin for 5 days prior to the procedure to allow safe biopsy and  dilation, if this is cleared by his cardiologist, Dr. Elsie Lincoln.  He  would likely be able to resume Coumadin immediately following the procedure,  if the procedure is uncomplicated.                                   Venita Lick. Pleas Koch., MD, Clementeen Graham   MTS/MedQ  DD:  11/21/2005  DT:  11/22/2005  Job #:  [id number]   cc:   Jeannett Senior A. Evlyn Kanner, MD

## 2010-09-22 NOTE — Discharge Summary (Signed)
NAMEKIELAN, DREISBACH                ACCOUNT NO.:  [id number]   MEDICAL RECORD NO.:  [id number]          PATIENT TYPE:  INP   LOCATION:  2924                         FACILITY:  [facility]   PHYSICIAN:  Antonieta Iba, MD   DATE OF BIRTH:  [birth year]   DATE OF ADMISSION:  11/08/2008  DATE OF DISCHARGE:  11/09/2008                               DISCHARGE SUMMARY   DISCHARGE DIAGNOSES:  1. Chest pain, negative myocardial infarction.  2. Atrial fibrillation with history of paroxysmal atrial fibrillation      in the past on anticoagulation with Coumadin.  3. Hypertension with impaired left ventricle relaxation on echo.  4. History of  Barrett esophagus.  5. Remote esophageal cancer.   DISCHARGE CONDITION:  Improved.   PROCEDURE:  None.   DISCHARGE MEDICATIONS:   Dictation ended at this point.      Darcella Gasman. Annie Paras, N.P.      Antonieta Iba, MD  Electronically Signed    LRI/MEDQ  D:  12/02/2008  T:  12/03/2008  Job:  [id number]   cc:   Tera Mater. Evlyn Kanner, M.D.  Dr.  Mardene Speak T. Russella Dar, MD, Clementeen Graham

## 2010-12-14 ENCOUNTER — Encounter: Payer: Self-pay | Admitting: Gastroenterology

## 2011-01-11 ENCOUNTER — Ambulatory Visit (INDEPENDENT_AMBULATORY_CARE_PROVIDER_SITE_OTHER): Payer: 59 | Admitting: Gastroenterology

## 2011-01-11 ENCOUNTER — Encounter: Payer: Self-pay | Admitting: Gastroenterology

## 2011-01-11 VITALS — BP 148/78 | HR 76 | Ht 69.0 in | Wt 160.0 lb

## 2011-01-11 DIAGNOSIS — Z1211 Encounter for screening for malignant neoplasm of colon: Secondary | ICD-10-CM

## 2011-01-11 DIAGNOSIS — K219 Gastro-esophageal reflux disease without esophagitis: Secondary | ICD-10-CM

## 2011-01-11 NOTE — Patient Instructions (Signed)
cc: Adrian Prince, MD        Hillis Range, MD

## 2011-01-11 NOTE — Progress Notes (Signed)
History of Present Illness: This is an 77 year old male who I followed in the past with a history of early stage esophageal adenocarcinoma arising in Barrett's mucosa. Adenocarcinoma was diagnosed in 1996 and he underwent an esophagectomy. He had a significant anastomotic stricture with multiple dilations performed over the next several years. His reflux symptoms are well controlled on ranitidine 300 mg at bedtime. Upper endoscopy and colonoscopy were performed in September 2008. Upper endoscopy revealed a prior esophagectomy with cervical esophagogastric anastomosis, an anastomotic stricture measuring approximately 15 mm and there was no evidence of Barrett's mucosa. Colonoscopy revealed diverticulosis and internal hemorrhoids. He has had chronic problems with early satiety and mild postprandial epigastric discomfort likely related to his prior surgery and delayed gastric emptying. Denies weight loss, abdominal pain, constipation, diarrhea, change in stool caliber, melena, hematochezia, nausea, vomiting, dysphagia, reflux symptoms, chest pain.  Past Medical History  Diagnosis Date  . Diverticulosis   . Internal hemorrhoids   . Adenocarcinoma of esophagus 02/1995    arising in Barrett's esophagus  . Esophageal stricture   . Paroxysmal atrial fibrillation   . Barrett's esophagus 05/1988  . Hypertension   . Gout   . Hyperlipidemia   . GERD (gastroesophageal reflux disease)   . CVA (cerebral vascular accident)   . Kidney disease, chronic, stage III (moderate, EGFR 30-59 ml/min)   . Hiatal hernia   . Arthritis    Past Surgical History  Procedure Date  . Partial esophagectomy 02/1995  . Inguinal hernia repair 1610,9604  . Cardiac electrophysiology study and ablation 2011    reports that he quit smoking about 52 years ago. He does not have any smokeless tobacco history on file. He reports that he drinks alcohol. He reports that he does not use illicit drugs. family history includes Breast cancer  in his sister; Diabetes in his sister; and Stroke in his father and mother.  There is no history of Colon cancer. No Known Allergies    Outpatient Encounter Prescriptions as of 01/11/2011  Medication Sig Dispense Refill  . allopurinol (ZYLOPRIM) 300 MG tablet Take 300 mg by mouth daily.        . fish oil-omega-3 fatty acids 1000 MG capsule Take 2 g by mouth daily.        . nebivolol (BYSTOLIC) 10 MG tablet Take 10 mg by mouth daily.        . ranitidine (ZANTAC) 300 MG tablet Take 300 mg by mouth at bedtime.        . rosuvastatin (CRESTOR) 10 MG tablet Take 10 mg by mouth 2 (two) times a week.         Review of Systems: Pertinent positive and negative review of systems were noted in the above HPI section. All other review of systems were otherwise negative.  Physical Exam: General: Well developed , well nourished, no acute distress Head: Normocephalic and atraumatic Eyes:  sclerae anicteric, EOMI Ears: Normal auditory acuity Mouth: No deformity or lesions Neck: Supple, no masses or thyromegaly Lungs: Clear throughout to auscultation Heart: Regular rate and rhythm; no murmurs, rubs or bruits Abdomen: Soft, non tender and non distended. No masses, hepatosplenomegaly or hernias noted. Normal Bowel sounds Musculoskeletal: Symmetrical with no gross deformities  Skin: No lesions on visible extremities Pulses:  Normal pulses noted Extremities: No clubbing, cyanosis, edema or deformities noted Neurological: Alert oriented x 4, grossly nonfocal Cervical Nodes:  No significant cervical adenopathy Inguinal Nodes: No significant inguinal adenopathy Psychological:  Alert and cooperative. Normal mood and affect  Assessment and Recommendations:  1. History of esophageal adenocarcinoma arising in Barrett's. Status post esophagectomy. He underwent complete resection of this Barrett's mucosa. No longer has difficulties with dysphagia. Recommended frequent small meals and snacks to help with early  satiety and postprandial discomfort with larger meals. As there is no longer Barrett's mucosa and it is over 15 years since his cancer was diagnosed, there is no need for surveillance endoscopies.  2. GERD. Continue standard antireflux measures and ranitidine 300 mg at bedtime.  3. Colorectal cancer screening average risk. Colonoscopy in 2008 with no polyps. No plans for future elective screening colonoscopies due to age and absence of increased risk factors for colon cancer.

## 2011-01-15 ENCOUNTER — Encounter: Payer: Self-pay | Admitting: Gastroenterology

## 2011-04-04 ENCOUNTER — Encounter: Payer: Self-pay | Admitting: Internal Medicine

## 2011-04-04 ENCOUNTER — Ambulatory Visit (INDEPENDENT_AMBULATORY_CARE_PROVIDER_SITE_OTHER): Payer: 59 | Admitting: Internal Medicine

## 2011-04-04 DIAGNOSIS — I10 Essential (primary) hypertension: Secondary | ICD-10-CM

## 2011-04-04 DIAGNOSIS — I4891 Unspecified atrial fibrillation: Secondary | ICD-10-CM

## 2011-04-04 NOTE — Assessment & Plan Note (Signed)
No further afib post ablation Continue ASA (he declines antiocoagulation)

## 2011-04-04 NOTE — Progress Notes (Signed)
PCP:  Julian Hy, MD  The patient presents today for routine electrophysiology followup.  Since last being seen in our clinic, the patient reports doing very well.  He denies any afib.  Today, he denies symptoms of chest pain, shortness of breath, orthopnea, PND, lower extremity edema, dizziness, presyncope, syncope, or neurologic sequela.  The patient feels that he is tolerating medications without difficulties and is otherwise without complaint today.   Past Medical History  Diagnosis Date  . Diverticulosis   . Internal hemorrhoids   . Adenocarcinoma of esophagus 02/1995    arising in Barrett's esophagus  . Esophageal stricture   . Paroxysmal atrial fibrillation     s/p afib ablation 1/11  . Barrett's esophagus 05/1988  . Hypertension   . Gout   . Hyperlipidemia   . GERD (gastroesophageal reflux disease)   . CVA (cerebral vascular accident)   . Kidney disease, chronic, stage III (moderate, EGFR 30-59 ml/min)   . Hiatal hernia   . Arthritis    Past Surgical History  Procedure Date  . Partial esophagectomy 02/1995  . Inguinal hernia repair [id number]  . Cardiac electrophysiology study and ablation 2011    afib ablation by [facility] 1/11    Current Outpatient Prescriptions  Medication Sig Dispense Refill  . allopurinol (ZYLOPRIM) 300 MG tablet Take 300 mg by mouth daily.        . fish oil-omega-3 fatty acids 1000 MG capsule Take 2 g by mouth daily.        Marland Kitchen MICARDIS 40 MG tablet Take 40 mg by mouth daily.       . ranitidine (ZANTAC) 300 MG tablet Take 300 mg by mouth at bedtime.        . rosuvastatin (CRESTOR) 10 MG tablet Take 10 mg by mouth 2 (two) times a week.          No Known Allergies  History   Social History  . Marital Status: Married    Spouse Name: N/A    Number of Children: 5  . Years of Education: N/A   Occupational History  . Retired  At [employer]   Social History Main Topics  . Smoking status: Former Smoker    Quit date: 05/07/1958  . Smokeless  tobacco: Not on file  . Alcohol Use: Yes     occasional  . Drug Use: No  . Sexually Active: Not on file   Other Topics Concern  . Not on file   Social History Narrative  . No narrative on file    Family History  Problem Relation Age of Onset  . Breast cancer Sister   . Diabetes Sister   . Stroke Mother   . Stroke Father   . Colon cancer Neg Hx    Physical Exam: Filed Vitals:   04/04/11 0945  BP: 136/86  Pulse: 77  Resp: 18  Height: 5\' 9"  (1.753 m)  Weight: 161 lb (73.029 kg)    GEN- The patient is well appearing, alert and oriented x 3 today.   Head- normocephalic, atraumatic Eyes-  Sclera clear, conjunctiva pink Ears- hearing intact Oropharynx- clear Neck- supple, no JVP Lymph- no cervical lymphadenopathy Lungs- Clear to ausculation bilaterally, normal work of breathing Heart- Regular rate and rhythm, no murmurs, rubs or gallops, PMI not laterally displaced GI- soft, NT, ND, + BS Extremities- no clubbing, cyanosis, or edema MS- no significant deformity or atrophy Skin- no rash or lesion Psych- euthymic mood, full affect Neuro- strength and sensation are intact  ekg today reveals sinus rhythm 77 bpm, PR 208, LAHB  Assessment and Plan:

## 2011-04-04 NOTE — Assessment & Plan Note (Signed)
Stable No change required today  

## 2011-04-04 NOTE — Patient Instructions (Signed)
Your physician wants you to follow-up in: 12 months with Dr Allred You will receive a reminder letter in the mail two months in advance. If you don't receive a letter, please call our office to schedule the follow-up appointment.  

## 2012-12-17 ENCOUNTER — Encounter: Payer: Self-pay | Admitting: Gastroenterology

## 2014-02-21 ENCOUNTER — Telehealth: Payer: Self-pay | Admitting: Internal Medicine

## 2014-02-21 NOTE — Telephone Encounter (Signed)
Spoke with patient by phone in follow-up.  He has had no afib post ablation and is off of AAD therapy.  He states "my heart hasnt missed a beat since the ablation".  He is very pleased with results. He will follow-up with Dr Forde Dandy as scheduled and I will see as needed going forward.

## 2014-08-31 ENCOUNTER — Emergency Department (HOSPITAL_COMMUNITY): Payer: Medicare Other

## 2014-08-31 ENCOUNTER — Encounter (HOSPITAL_COMMUNITY): Payer: Self-pay

## 2014-08-31 ENCOUNTER — Observation Stay (HOSPITAL_COMMUNITY)
Admission: EM | Admit: 2014-08-31 | Discharge: 2014-09-01 | Disposition: A | Discharge status: Home / Self Care | Payer: Medicare Other | Attending: General Surgery | Admitting: General Surgery

## 2014-08-31 DIAGNOSIS — R109 Unspecified abdominal pain: Secondary | ICD-10-CM

## 2014-08-31 DIAGNOSIS — S060X0A Concussion without loss of consciousness, initial encounter: Secondary | ICD-10-CM | POA: Insufficient documentation

## 2014-08-31 DIAGNOSIS — R413 Other amnesia: Secondary | ICD-10-CM | POA: Insufficient documentation

## 2014-08-31 DIAGNOSIS — I129 Hypertensive chronic kidney disease with stage 1 through stage 4 chronic kidney disease, or unspecified chronic kidney disease: Secondary | ICD-10-CM | POA: Insufficient documentation

## 2014-08-31 DIAGNOSIS — N183 Chronic kidney disease, stage 3 (moderate): Secondary | ICD-10-CM | POA: Insufficient documentation

## 2014-08-31 DIAGNOSIS — T1490XA Injury, unspecified, initial encounter: Secondary | ICD-10-CM

## 2014-08-31 DIAGNOSIS — K219 Gastro-esophageal reflux disease without esophagitis: Secondary | ICD-10-CM | POA: Diagnosis not present

## 2014-08-31 DIAGNOSIS — E785 Hyperlipidemia, unspecified: Secondary | ICD-10-CM | POA: Insufficient documentation

## 2014-08-31 DIAGNOSIS — Z8673 Personal history of transient ischemic attack (TIA), and cerebral infarction without residual deficits: Secondary | ICD-10-CM | POA: Diagnosis not present

## 2014-08-31 DIAGNOSIS — S36892A Contusion of other intra-abdominal organs, initial encounter: Secondary | ICD-10-CM | POA: Diagnosis not present

## 2014-08-31 DIAGNOSIS — Z87891 Personal history of nicotine dependence: Secondary | ICD-10-CM | POA: Insufficient documentation

## 2014-08-31 DIAGNOSIS — Z8501 Personal history of malignant neoplasm of esophagus: Secondary | ICD-10-CM | POA: Diagnosis not present

## 2014-08-31 LAB — CBC WITH DIFFERENTIAL/PLATELET
BASOS ABS: 0.1 10*3/uL (ref 0.0–0.1)
BASOS PCT: 1 % (ref 0–1)
Eosinophils Absolute: 0.3 10*3/uL (ref 0.0–0.7)
Eosinophils Relative: 3 % (ref 0–5)
HCT: 43.9 % (ref 39.0–52.0)
HEMOGLOBIN: 15.4 g/dL (ref 13.0–17.0)
LYMPHS ABS: 1.5 10*3/uL (ref 0.7–4.0)
Lymphocytes Relative: 17 % (ref 12–46)
MCH: 32.4 pg (ref 26.0–34.0)
MCHC: 35.1 g/dL (ref 30.0–36.0)
MCV: 92.2 fL (ref 78.0–100.0)
MONO ABS: 0.6 10*3/uL (ref 0.1–1.0)
MONOS PCT: 6 % (ref 3–12)
Neutro Abs: 6.5 10*3/uL (ref 1.7–7.7)
Neutrophils Relative %: 73 % (ref 43–77)
Platelets: 174 10*3/uL (ref 150–400)
RBC: 4.76 MIL/uL (ref 4.22–5.81)
RDW: 12.7 % (ref 11.5–15.5)
WBC: 8.9 10*3/uL (ref 4.0–10.5)

## 2014-08-31 MED ORDER — ALLOPURINOL 300 MG PO TABS
300.0000 mg | ORAL_TABLET | Freq: Every day | ORAL | Status: DC
  Filled 2014-08-31: qty 1

## 2014-08-31 MED ORDER — ACETAMINOPHEN 325 MG PO TABS: 650.0000 mg | ORAL_TABLET | ORAL | Status: DC | PRN

## 2014-08-31 MED ORDER — FENTANYL CITRATE (PF) 100 MCG/2ML IJ SOLN
25.0000 ug | Freq: Once | INTRAMUSCULAR | Status: AC
  Administered 2014-08-31: 25 ug via INTRAVENOUS
  Filled 2014-08-31: qty 2

## 2014-08-31 MED ORDER — SODIUM CHLORIDE 0.9 % IV BOLUS (SEPSIS)
1000.0000 mL | Freq: Once | INTRAVENOUS | Status: AC
  Administered 2014-08-31: 1000 mL via INTRAVENOUS

## 2014-08-31 MED ORDER — ONDANSETRON HCL 4 MG/2ML IJ SOLN: 4.0000 mg | Freq: Four times a day (QID) | INTRAMUSCULAR | Status: DC | PRN

## 2014-08-31 MED ORDER — KCL IN DEXTROSE-NACL 20-5-0.45 MEQ/L-%-% IV SOLN
INTRAVENOUS | Status: DC
  Administered 2014-08-31: 22:00:00 via INTRAVENOUS
  Filled 2014-08-31 (×2): qty 1000

## 2014-08-31 MED ORDER — OXYCODONE HCL 5 MG PO TABS
5.0000 mg | ORAL_TABLET | ORAL | Status: DC | PRN
  Administered 2014-08-31: 5 mg via ORAL
  Filled 2014-08-31: qty 1

## 2014-08-31 MED ORDER — PANTOPRAZOLE SODIUM 40 MG PO TBEC
40.0000 mg | DELAYED_RELEASE_TABLET | Freq: Every day | ORAL | Status: DC
  Administered 2014-08-31: 40 mg via ORAL
  Filled 2014-08-31: qty 1

## 2014-08-31 MED ORDER — PANTOPRAZOLE SODIUM 40 MG IV SOLR: 40.0000 mg | Freq: Every day | INTRAVENOUS | Status: DC

## 2014-08-31 MED ORDER — HYDRALAZINE HCL 20 MG/ML IJ SOLN: 10.0000 mg | INTRAMUSCULAR | Status: DC | PRN

## 2014-08-31 MED ORDER — IRBESARTAN 150 MG PO TABS: 150.0000 mg | ORAL_TABLET | Freq: Every day | ORAL | Status: DC

## 2014-08-31 MED ORDER — ROSUVASTATIN CALCIUM 10 MG PO TABS
10.0000 mg | ORAL_TABLET | ORAL | Status: DC
  Filled 2014-08-31: qty 1

## 2014-08-31 MED ORDER — OXYCODONE HCL 5 MG PO TABS: 10.0000 mg | ORAL_TABLET | ORAL | Status: DC | PRN

## 2014-08-31 MED ORDER — MORPHINE SULFATE 2 MG/ML IJ SOLN: 2.0000 mg | INTRAMUSCULAR | Status: DC | PRN

## 2014-08-31 MED ORDER — ONDANSETRON HCL 4 MG PO TABS: 4.0000 mg | ORAL_TABLET | Freq: Four times a day (QID) | ORAL | Status: DC | PRN

## 2014-08-31 NOTE — ED Notes (Signed)
Pt returned to room. Monitored by pulse ox, bp cuff, and 12-lead.

## 2014-08-31 NOTE — ED Notes (Signed)
Pt placed in gown and in bed. Pt monitored by pulse ox, bp cuff, and 12-lead. 
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
## 2014-08-31 NOTE — ED Notes (Signed)
[facility] at bedside 
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
## 2014-09-01 DIAGNOSIS — S36892A Contusion of other intra-abdominal organs, initial encounter: Secondary | ICD-10-CM | POA: Diagnosis not present

## 2014-09-01 LAB — CBC
HCT: 37.9 % — ABNORMAL LOW (ref 39.0–52.0)
Hemoglobin: 13.1 g/dL (ref 13.0–17.0)
MCH: 32.1 pg (ref 26.0–34.0)
MCHC: 34.6 g/dL (ref 30.0–36.0)
MCV: 92.9 fL (ref 78.0–100.0)
Platelets: 171 10*3/uL (ref 150–400)
RBC: 4.08 MIL/uL — ABNORMAL LOW (ref 4.22–5.81)
RDW: 12.8 % (ref 11.5–15.5)
WBC: 8.2 10*3/uL (ref 4.0–10.5)

## 2014-09-01 MED ORDER — OXYCODONE-ACETAMINOPHEN 5-325 MG PO TABS: 1.0000 | ORAL_TABLET | ORAL | Status: DC | PRN

## 2014-09-01 NOTE — Progress Notes (Signed)
Discharge instructions/prescription given and explained to pt and pt's wife.  Both verbalize understanding of all orders/instructions and deny any questions at this time.  IV removed and site CDI.  Pt discharged to home with all belongings via volunteer wheelchair services in no s/s of distress. Graceann Congress

## 2014-09-01 NOTE — Discharge Instructions (Signed)
No driving while taking oxycodone. °
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
## 2014-09-06 ENCOUNTER — Telehealth (HOSPITAL_COMMUNITY): Payer: Self-pay

## 2014-09-06 NOTE — Telephone Encounter (Signed)
Spoke with wife of pt who says that he seems to be doing much worse. Developed some severe right sided CP that he didn't have at the time of discharge. Also says he's had some gross hematuria and a single temp elevation to 104. I told her that we shouldn't wait until clinic on Wednesday to see him and that he should be seen today, either at PCP or UC or ED. She was going to call PCP's office.

## 2014-09-08 ENCOUNTER — Encounter (HOSPITAL_COMMUNITY): Payer: Self-pay | Admitting: Vascular Surgery

## 2014-09-08 ENCOUNTER — Inpatient Hospital Stay (HOSPITAL_COMMUNITY)
Admission: EM | Admit: 2014-09-08 | Discharge: 2014-09-10 | DRG: 193 | Disposition: A | Discharge status: Home / Self Care | Payer: Medicare Other | Attending: Internal Medicine | Admitting: Internal Medicine

## 2014-09-08 ENCOUNTER — Emergency Department (HOSPITAL_COMMUNITY): Payer: Medicare Other

## 2014-09-08 ENCOUNTER — Inpatient Hospital Stay (HOSPITAL_COMMUNITY): Payer: Medicare Other

## 2014-09-08 DIAGNOSIS — N189 Chronic kidney disease, unspecified: Secondary | ICD-10-CM

## 2014-09-08 DIAGNOSIS — M109 Gout, unspecified: Secondary | ICD-10-CM | POA: Diagnosis present

## 2014-09-08 DIAGNOSIS — J439 Emphysema, unspecified: Secondary | ICD-10-CM | POA: Diagnosis present

## 2014-09-08 DIAGNOSIS — Z8501 Personal history of malignant neoplasm of esophagus: Secondary | ICD-10-CM | POA: Diagnosis not present

## 2014-09-08 DIAGNOSIS — R7989 Other specified abnormal findings of blood chemistry: Secondary | ICD-10-CM

## 2014-09-08 DIAGNOSIS — N183 Chronic kidney disease, stage 3 (moderate): Secondary | ICD-10-CM | POA: Diagnosis present

## 2014-09-08 DIAGNOSIS — K222 Esophageal obstruction: Secondary | ICD-10-CM | POA: Diagnosis present

## 2014-09-08 DIAGNOSIS — K219 Gastro-esophageal reflux disease without esophagitis: Secondary | ICD-10-CM | POA: Diagnosis present

## 2014-09-08 DIAGNOSIS — N179 Acute kidney failure, unspecified: Secondary | ICD-10-CM

## 2014-09-08 DIAGNOSIS — I480 Paroxysmal atrial fibrillation: Secondary | ICD-10-CM | POA: Diagnosis present

## 2014-09-08 DIAGNOSIS — R0902 Hypoxemia: Secondary | ICD-10-CM | POA: Diagnosis present

## 2014-09-08 DIAGNOSIS — I4891 Unspecified atrial fibrillation: Secondary | ICD-10-CM | POA: Diagnosis present

## 2014-09-08 DIAGNOSIS — S2242XA Multiple fractures of ribs, left side, initial encounter for closed fracture: Secondary | ICD-10-CM | POA: Diagnosis present

## 2014-09-08 DIAGNOSIS — E860 Dehydration: Secondary | ICD-10-CM | POA: Diagnosis present

## 2014-09-08 DIAGNOSIS — Y95 Nosocomial condition: Secondary | ICD-10-CM | POA: Diagnosis present

## 2014-09-08 DIAGNOSIS — R52 Pain, unspecified: Secondary | ICD-10-CM

## 2014-09-08 DIAGNOSIS — J189 Pneumonia, unspecified organism: Principal | ICD-10-CM

## 2014-09-08 DIAGNOSIS — I10 Essential (primary) hypertension: Secondary | ICD-10-CM | POA: Diagnosis not present

## 2014-09-08 DIAGNOSIS — I129 Hypertensive chronic kidney disease with stage 1 through stage 4 chronic kidney disease, or unspecified chronic kidney disease: Secondary | ICD-10-CM | POA: Diagnosis present

## 2014-09-08 DIAGNOSIS — K5790 Diverticulosis of intestine, part unspecified, without perforation or abscess without bleeding: Secondary | ICD-10-CM | POA: Diagnosis present

## 2014-09-08 DIAGNOSIS — R0789 Other chest pain: Secondary | ICD-10-CM

## 2014-09-08 DIAGNOSIS — R05 Cough: Secondary | ICD-10-CM | POA: Diagnosis present

## 2014-09-08 DIAGNOSIS — Z87891 Personal history of nicotine dependence: Secondary | ICD-10-CM

## 2014-09-08 DIAGNOSIS — M1990 Unspecified osteoarthritis, unspecified site: Secondary | ICD-10-CM | POA: Diagnosis present

## 2014-09-08 DIAGNOSIS — Z8673 Personal history of transient ischemic attack (TIA), and cerebral infarction without residual deficits: Secondary | ICD-10-CM

## 2014-09-08 DIAGNOSIS — J9601 Acute respiratory failure with hypoxia: Secondary | ICD-10-CM | POA: Diagnosis present

## 2014-09-08 DIAGNOSIS — R079 Chest pain, unspecified: Secondary | ICD-10-CM

## 2014-09-08 DIAGNOSIS — E780 Pure hypercholesterolemia: Secondary | ICD-10-CM | POA: Diagnosis present

## 2014-09-08 DIAGNOSIS — E785 Hyperlipidemia, unspecified: Secondary | ICD-10-CM | POA: Diagnosis present

## 2014-09-08 DIAGNOSIS — E872 Acidosis: Secondary | ICD-10-CM | POA: Diagnosis present

## 2014-09-08 HISTORY — DX: Emphysema, unspecified: J43.9

## 2014-09-08 HISTORY — DX: Pneumonia, unspecified organism: J18.9

## 2014-09-08 LAB — URINALYSIS, ROUTINE W REFLEX MICROSCOPIC
Glucose, UA: NEGATIVE mg/dL
KETONES UR: 15 mg/dL — AB
LEUKOCYTES UA: NEGATIVE
NITRITE: NEGATIVE
Protein, ur: 100 mg/dL — AB
Specific Gravity, Urine: 1.025 (ref 1.005–1.030)
UROBILINOGEN UA: 1 mg/dL (ref 0.0–1.0)
pH: 5 (ref 5.0–8.0)

## 2014-09-08 LAB — CBC WITH DIFFERENTIAL/PLATELET
Basophils Absolute: 0 10*3/uL (ref 0.0–0.1)
Basophils Relative: 0 % (ref 0–1)
EOS PCT: 2 % (ref 0–5)
Eosinophils Absolute: 0.2 10*3/uL (ref 0.0–0.7)
HEMATOCRIT: 36.3 % — AB (ref 39.0–52.0)
Hemoglobin: 12.2 g/dL — ABNORMAL LOW (ref 13.0–17.0)
LYMPHS ABS: 0.6 10*3/uL — AB (ref 0.7–4.0)
LYMPHS PCT: 5 % — AB (ref 12–46)
MCH: 32.1 pg (ref 26.0–34.0)
MCHC: 33.6 g/dL (ref 30.0–36.0)
MCV: 95.5 fL (ref 78.0–100.0)
Monocytes Absolute: 1 10*3/uL (ref 0.1–1.0)
Monocytes Relative: 8 % (ref 3–12)
NEUTROS ABS: 10.2 10*3/uL — AB (ref 1.7–7.7)
Neutrophils Relative %: 85 % — ABNORMAL HIGH (ref 43–77)
Platelets: 251 10*3/uL (ref 150–400)
RBC: 3.8 MIL/uL — ABNORMAL LOW (ref 4.22–5.81)
RDW: 12.8 % (ref 11.5–15.5)
WBC: 11.9 10*3/uL — AB (ref 4.0–10.5)

## 2014-09-08 LAB — I-STAT CG4 LACTIC ACID, ED
Lactic Acid, Venous: 2.45 mmol/L (ref 0.5–2.0)
Lactic Acid, Venous: 1.32 mmol/L (ref 0.5–2.0)

## 2014-09-08 LAB — BASIC METABOLIC PANEL
ANION GAP: 11 (ref 5–15)
BUN: 28 mg/dL — AB (ref 6–20)
CO2: 24 mmol/L (ref 22–32)
CREATININE: 2.25 mg/dL — AB (ref 0.61–1.24)
Calcium: 8.3 mg/dL — ABNORMAL LOW (ref 8.9–10.3)
Chloride: 104 mmol/L (ref 101–111)
GFR calc Af Amer: 29 mL/min — ABNORMAL LOW (ref >60)
GFR calc non Af Amer: 25 mL/min — ABNORMAL LOW (ref >60)
Glucose, Bld: 172 mg/dL — ABNORMAL HIGH (ref 70–99)
Potassium: 4.3 mmol/L (ref 3.5–5.1)
Sodium: 139 mmol/L (ref 135–145)

## 2014-09-08 LAB — URINE MICROSCOPIC-ADD ON

## 2014-09-08 LAB — TROPONIN I
TROPONIN I: 0.04 ng/mL — AB (ref <0.031)
TROPONIN I: 0.05 ng/mL — AB (ref <0.031)
Troponin I: 0.09 ng/mL — ABNORMAL HIGH (ref <0.031)

## 2014-09-08 LAB — LACTIC ACID, PLASMA
LACTIC ACID, VENOUS: 1.9 mmol/L (ref 0.5–2.0)
LACTIC ACID, VENOUS: 2.1 mmol/L — AB (ref 0.5–2.0)

## 2014-09-08 LAB — BRAIN NATRIURETIC PEPTIDE: B Natriuretic Peptide: 208.2 pg/mL — ABNORMAL HIGH (ref 0.0–100.0)

## 2014-09-08 MED ORDER — TECHNETIUM TO 99M ALBUMIN AGGREGATED
6.0000 | Freq: Once | INTRAVENOUS | Status: AC | PRN
  Administered 2014-09-08: 6 via INTRAVENOUS

## 2014-09-08 MED ORDER — SODIUM CHLORIDE 0.9 % IV SOLN
INTRAVENOUS | Status: AC
  Administered 2014-09-08: 16:00:00 via INTRAVENOUS

## 2014-09-08 MED ORDER — MORPHINE SULFATE 2 MG/ML IJ SOLN: 0.5000 mg | INTRAMUSCULAR | Status: DC | PRN

## 2014-09-08 MED ORDER — ALBUTEROL SULFATE (2.5 MG/3ML) 0.083% IN NEBU
2.5000 mg | INHALATION_SOLUTION | RESPIRATORY_TRACT | Status: DC | PRN
  Administered 2014-09-08: 2.5 mg via RESPIRATORY_TRACT
  Filled 2014-09-08: qty 3

## 2014-09-08 MED ORDER — ACETAMINOPHEN 500 MG PO TABS
1000.0000 mg | ORAL_TABLET | Freq: Once | ORAL | Status: AC
  Administered 2014-09-08: 1000 mg via ORAL
  Filled 2014-09-08: qty 2

## 2014-09-08 MED ORDER — DOCUSATE SODIUM 100 MG PO CAPS
100.0000 mg | ORAL_CAPSULE | Freq: Two times a day (BID) | ORAL | Status: DC
  Administered 2014-09-08 – 2014-09-10 (×4): 100 mg via ORAL
  Filled 2014-09-08 (×5): qty 1

## 2014-09-08 MED ORDER — TECHNETIUM TC 99M DIETHYLENETRIAME-PENTAACETIC ACID: 40.0000 | Freq: Once | INTRAVENOUS | Status: AC | PRN

## 2014-09-08 MED ORDER — VANCOMYCIN HCL IN DEXTROSE 750-5 MG/150ML-% IV SOLN
750.0000 mg | INTRAVENOUS | Status: DC
  Administered 2014-09-09: 750 mg via INTRAVENOUS
  Filled 2014-09-08 (×2): qty 150

## 2014-09-08 MED ORDER — HEPARIN SODIUM (PORCINE) 5000 UNIT/ML IJ SOLN
5000.0000 [IU] | Freq: Three times a day (TID) | INTRAMUSCULAR | Status: DC
  Administered 2014-09-08 – 2014-09-10 (×5): 5000 [IU] via SUBCUTANEOUS
  Filled 2014-09-08 (×8): qty 1

## 2014-09-08 MED ORDER — IPRATROPIUM-ALBUTEROL 0.5-2.5 (3) MG/3ML IN SOLN
3.0000 mL | Freq: Once | RESPIRATORY_TRACT | Status: AC
  Administered 2014-09-08: 3 mL via RESPIRATORY_TRACT
  Filled 2014-09-08: qty 3

## 2014-09-08 MED ORDER — SODIUM CHLORIDE 0.9 % IV BOLUS (SEPSIS)
500.0000 mL | Freq: Once | INTRAVENOUS | Status: AC
  Administered 2014-09-08: 500 mL via INTRAVENOUS

## 2014-09-08 MED ORDER — ONDANSETRON HCL 4 MG PO TABS: 4.0000 mg | ORAL_TABLET | Freq: Four times a day (QID) | ORAL | Status: DC | PRN

## 2014-09-08 MED ORDER — SODIUM CHLORIDE 0.9 % IJ SOLN
3.0000 mL | Freq: Two times a day (BID) | INTRAMUSCULAR | Status: DC
  Administered 2014-09-09 (×2): 3 mL via INTRAVENOUS

## 2014-09-08 MED ORDER — CEFEPIME HCL 1 G IJ SOLR
1.0000 g | Freq: Once | INTRAMUSCULAR | Status: AC
  Administered 2014-09-08: 1 g via INTRAVENOUS
  Filled 2014-09-08: qty 1

## 2014-09-08 MED ORDER — ACETAMINOPHEN 325 MG PO TABS
650.0000 mg | ORAL_TABLET | Freq: Four times a day (QID) | ORAL | Status: DC | PRN
  Administered 2014-09-08 – 2014-09-09 (×2): 650 mg via ORAL
  Filled 2014-09-08 (×2): qty 2

## 2014-09-08 MED ORDER — VANCOMYCIN HCL 10 G IV SOLR
1500.0000 mg | INTRAVENOUS | Status: AC
  Administered 2014-09-08: 1500 mg via INTRAVENOUS
  Filled 2014-09-08: qty 1500

## 2014-09-08 MED ORDER — SODIUM CHLORIDE 0.9 % IV SOLN
INTRAVENOUS | Status: DC
  Administered 2014-09-08: 13:00:00 via INTRAVENOUS

## 2014-09-08 MED ORDER — HYDRALAZINE HCL 20 MG/ML IJ SOLN
10.0000 mg | Freq: Four times a day (QID) | INTRAMUSCULAR | Status: DC | PRN
Start: 2014-09-08 — End: 2014-09-10

## 2014-09-08 MED ORDER — OXYCODONE HCL 5 MG PO TABS
5.0000 mg | ORAL_TABLET | ORAL | Status: DC | PRN
  Administered 2014-09-09: 5 mg via ORAL
  Filled 2014-09-08: qty 1

## 2014-09-08 MED ORDER — ONDANSETRON HCL 4 MG/2ML IJ SOLN: 4.0000 mg | Freq: Four times a day (QID) | INTRAMUSCULAR | Status: DC | PRN

## 2014-09-08 MED ORDER — CETYLPYRIDINIUM CHLORIDE 0.05 % MT LIQD
7.0000 mL | Freq: Two times a day (BID) | OROMUCOSAL | Status: DC
  Administered 2014-09-09 – 2014-09-10 (×3): 7 mL via OROMUCOSAL

## 2014-09-08 MED ORDER — ACETAMINOPHEN 650 MG RE SUPP: 650.0000 mg | Freq: Four times a day (QID) | RECTAL | Status: DC | PRN

## 2014-09-08 MED ORDER — DEXTROSE 5 % IV SOLN
1.0000 g | INTRAVENOUS | Status: DC
  Administered 2014-09-09 – 2014-09-10 (×2): 1 g via INTRAVENOUS
  Filled 2014-09-08 (×2): qty 1

## 2014-09-08 NOTE — Progress Notes (Signed)
Pt transferred to room 6E30 from Ed via stretcher O2 @ 3 lmp via [place].  Pt denies pain at this time.
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
## 2014-09-08 NOTE — Progress Notes (Signed)
Paged Dr. [place] Pink bp 190/97  Temp 99.9 pt states he is cold back from vent/perf.  T/o give tylenol, breathing tx if needed. Hydralazine prn ordered for bp.

## 2014-09-08 NOTE — ED Notes (Signed)
Patient at 86% O2 saturation just lying in bed.  Will place patient on a nasal canula.

## 2014-09-08 NOTE — Progress Notes (Signed)
Tele afib paged Dr. Roxine Caddy
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
## 2014-09-08 NOTE — Progress Notes (Signed)
EKG sinus tach with premature supraventicular complexes Paged Dr. Roxine Caddy

## 2014-09-08 NOTE — Progress Notes (Signed)
Tele afib 108

## 2014-09-08 NOTE — ED Notes (Signed)
NOTIFIED DR. Elise Benne IN PERSON FOR PATIENTS LAB RESULTS OF CG4+LACTIC ACXID @12 :20PM ,09/08/2014.
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
## 2014-09-08 NOTE — Progress Notes (Signed)
T/O Dr. [place] Pink EKG

## 2014-09-08 NOTE — Progress Notes (Signed)
CRITICAL VALUE ALERT  Critical value received:  Lactic Acid 2.1  Date of notification:  09/08/2014  Time of notification:  2052  Critical value read back:Yes.    Nurse who received alert:  Dorothea Glassman  MD notified (1st page):  Chaney Malling, NP  Time of first page:  2057  No new orders received. Will continue to monitor.

## 2014-09-09 ENCOUNTER — Inpatient Hospital Stay (HOSPITAL_COMMUNITY): Payer: Medicare Other

## 2014-09-09 DIAGNOSIS — R079 Chest pain, unspecified: Secondary | ICD-10-CM

## 2014-09-09 DIAGNOSIS — I10 Essential (primary) hypertension: Secondary | ICD-10-CM

## 2014-09-09 LAB — COMPREHENSIVE METABOLIC PANEL
ALBUMIN: 2.4 g/dL — AB (ref 3.5–5.0)
ALT: 42 U/L (ref 17–63)
AST: 45 U/L — ABNORMAL HIGH (ref 15–41)
Alkaline Phosphatase: 106 U/L (ref 38–126)
Anion gap: 7 (ref 5–15)
BUN: 23 mg/dL — ABNORMAL HIGH (ref 6–20)
CALCIUM: 7.8 mg/dL — AB (ref 8.9–10.3)
CO2: 24 mmol/L (ref 22–32)
Chloride: 108 mmol/L (ref 101–111)
Creatinine, Ser: 1.89 mg/dL — ABNORMAL HIGH (ref 0.61–1.24)
GFR calc non Af Amer: 31 mL/min — ABNORMAL LOW (ref >60)
GFR, EST AFRICAN AMERICAN: 36 mL/min — AB (ref >60)
GLUCOSE: 162 mg/dL — AB (ref 70–99)
POTASSIUM: 4.2 mmol/L (ref 3.5–5.1)
SODIUM: 139 mmol/L (ref 135–145)
TOTAL PROTEIN: 5.5 g/dL — AB (ref 6.5–8.1)
Total Bilirubin: 1.5 mg/dL — ABNORMAL HIGH (ref 0.3–1.2)

## 2014-09-09 LAB — CBC
HEMATOCRIT: 33.6 % — AB (ref 39.0–52.0)
HEMOGLOBIN: 11.1 g/dL — AB (ref 13.0–17.0)
MCH: 31.6 pg (ref 26.0–34.0)
MCHC: 33 g/dL (ref 30.0–36.0)
MCV: 95.7 fL (ref 78.0–100.0)
Platelets: 234 10*3/uL (ref 150–400)
RBC: 3.51 MIL/uL — AB (ref 4.22–5.81)
RDW: 12.9 % (ref 11.5–15.5)
WBC: 11.2 10*3/uL — AB (ref 4.0–10.5)

## 2014-09-09 LAB — HIV ANTIBODY (ROUTINE TESTING W REFLEX): HIV Screen 4th Generation wRfx: NONREACTIVE

## 2014-09-09 LAB — STREP PNEUMONIAE URINARY ANTIGEN: Strep Pneumo Urinary Antigen: NEGATIVE

## 2014-09-09 LAB — URINE CULTURE
Colony Count: NO GROWTH
Culture: NO GROWTH

## 2014-09-09 LAB — LACTIC ACID, PLASMA
LACTIC ACID, VENOUS: 2.4 mmol/L — AB (ref 0.5–2.0)
Lactic Acid, Venous: 2.4 mmol/L (ref 0.5–2.0)

## 2014-09-09 LAB — TROPONIN I: TROPONIN I: 0.11 ng/mL — AB (ref <0.031)

## 2014-09-09 MED ORDER — BENZONATATE 100 MG PO CAPS: 100.0000 mg | ORAL_CAPSULE | Freq: Three times a day (TID) | ORAL | Status: DC | PRN

## 2014-09-09 MED ORDER — SODIUM CHLORIDE 0.9 % IV SOLN
INTRAVENOUS | Status: DC
  Administered 2014-09-09: 16:00:00 via INTRAVENOUS
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
## 2014-09-09 NOTE — Progress Notes (Signed)
UR completed. Acute therapies not recommending any HH at this time.  Sandi Mariscal, RN BSN [place] CCM Trauma/Neuro ICU Case Manager [phone]
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
## 2014-09-09 NOTE — Progress Notes (Signed)
CRITICAL VALUE ALERT  Critical value received:  Lactic acid 2.4  Date of notification:  09/09/14  Time of notification:  1219  Critical value read back:Yes.    Nurse who received alert:  Larena Glassman, RN  MD notified (1st page):  Dr. Broadus John  Time of first page:  1231  MD notified (2nd page):  Time of second page:  Responding MD:  Dr. Broadus John   Time MD responded:  [phone]
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
## 2014-09-09 NOTE — Progress Notes (Signed)
CRITICAL VALUE ALERT - LATE ENTRY  Critical value received:  Lactic acid 2.4  Date of notification:  09/09/14  Time of notification:  1610  Critical value read back:Yes.    Nurse who received alert:  Larena Glassman, RN  MD notified (1st page):  Dr. Broadus John  Time of first page:  1601  MD notified (2nd page):  Time of second page:  Responding MD:  Dr. Broadus John  Time MD responded:  [phone]

## 2014-09-10 LAB — BASIC METABOLIC PANEL
ANION GAP: 7 (ref 5–15)
BUN: 20 mg/dL (ref 6–20)
CALCIUM: 8.1 mg/dL — AB (ref 8.9–10.3)
CHLORIDE: 106 mmol/L (ref 101–111)
CO2: 26 mmol/L (ref 22–32)
Creatinine, Ser: 1.72 mg/dL — ABNORMAL HIGH (ref 0.61–1.24)
GFR calc Af Amer: 40 mL/min — ABNORMAL LOW (ref >60)
GFR, EST NON AFRICAN AMERICAN: 34 mL/min — AB (ref >60)
Glucose, Bld: 136 mg/dL — ABNORMAL HIGH (ref 70–99)
Potassium: 4.4 mmol/L (ref 3.5–5.1)
Sodium: 139 mmol/L (ref 135–145)

## 2014-09-10 LAB — CBC
HCT: 33.6 % — ABNORMAL LOW (ref 39.0–52.0)
HEMOGLOBIN: 11.1 g/dL — AB (ref 13.0–17.0)
MCH: 31.3 pg (ref 26.0–34.0)
MCHC: 33 g/dL (ref 30.0–36.0)
MCV: 94.6 fL (ref 78.0–100.0)
PLATELETS: 278 10*3/uL (ref 150–400)
RBC: 3.55 MIL/uL — AB (ref 4.22–5.81)
RDW: 12.8 % (ref 11.5–15.5)
WBC: 10.3 10*3/uL (ref 4.0–10.5)

## 2014-09-10 LAB — LEGIONELLA ANTIGEN, URINE

## 2014-09-10 LAB — LACTIC ACID, PLASMA: Lactic Acid, Venous: 1.1 mmol/L (ref 0.5–2.0)

## 2014-09-10 MED ORDER — LEVOFLOXACIN 500 MG PO TABS: 500.0000 mg | ORAL_TABLET | Freq: Every day | ORAL | Status: DC

## 2014-09-10 MED ORDER — AMLODIPINE BESYLATE 10 MG PO TABS: 10.0000 mg | ORAL_TABLET | Freq: Every day | ORAL | Status: AC
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
## 2014-09-10 NOTE — Progress Notes (Signed)
NCM spoke to pt and dtr in the room. No NCM needs identified. Jonnie Finner RN CCM Case Mgmt phone [phone]
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
## 2014-09-10 NOTE — Progress Notes (Signed)
Patient Discharge: Disposition:  Patient discharged home.  Education: Patient educated about medications, prescriptions, follow-up appointments, diet, discharge instructions and also handout on pneumonia. IV: Discontinued before discharge. Telemetry: Discontinued before discharge, CCMD notified. Transportation: Transported in w/c with the staff and family member accompanying out the unit. Belongings: Patient took all his belongings with him.

## 2014-09-15 LAB — CULTURE, BLOOD (ROUTINE X 2)
CULTURE: NO GROWTH
Culture: NO GROWTH

## 2014-12-31 ENCOUNTER — Telehealth: Payer: Self-pay | Admitting: Internal Medicine

## 2014-12-31 NOTE — Telephone Encounter (Signed)
New message     Pt heart is out of rhythm Pt states he is not having any other symptoms Please call to discuss

## 2014-12-31 NOTE — Telephone Encounter (Signed)
Calling stating he had an episode this AM of being out of rhythm. Stated it lasted about 2 hrs. No other symptoms like SOB or dizziness. States he felt fine then and feels good now. States he has not any problems with heart since ablation in 2012.  Advised that if he feels heart out of rhythm again and is symptomatic he should go to ER.  He verbalizes understanding.  Advised will send message to Dr. Rayann Heman to make him aware that he had an episode.

## 2015-01-03 NOTE — Telephone Encounter (Signed)
Please do not refer routine afib patients to the ED.  We have significant efforts in place to keep AF patients out of the ED.  AF should typically be an outpatient management issue.  Avoidance of the ED reduces patient anxiety and costs and improves patient quality of life.  Would refer pt to the AF clinic.  Thanks!

## 2015-01-03 NOTE — Telephone Encounter (Signed)
Called and spoke with patient's wife and gave her the number to the afib clinic.  Also let her know Dr Jackalyn Lombard recommendations as far as handling as an out patient.  Let her know that the first 3 months after an ablation irregularities are expected.  Would need to get an EKG to assess rhythm prior to making changes.  She verbalized understanding and was appreciative on my call

## 2019-06-22 ENCOUNTER — Other Ambulatory Visit (HOSPITAL_COMMUNITY): Payer: Self-pay | Admitting: Endocrinology

## 2019-06-22 DIAGNOSIS — I480 Paroxysmal atrial fibrillation: Secondary | ICD-10-CM

## 2019-06-22 DIAGNOSIS — R0609 Other forms of dyspnea: Secondary | ICD-10-CM

## 2019-07-14 ENCOUNTER — Encounter (HOSPITAL_COMMUNITY): Payer: Self-pay | Admitting: Endocrinology

## 2019-08-10 ENCOUNTER — Ambulatory Visit (HOSPITAL_COMMUNITY): Payer: Medicare Other | Attending: Cardiovascular Disease

## 2019-08-10 ENCOUNTER — Other Ambulatory Visit: Payer: Self-pay

## 2019-08-10 DIAGNOSIS — R0609 Other forms of dyspnea: Secondary | ICD-10-CM | POA: Diagnosis not present

## 2019-08-10 DIAGNOSIS — I480 Paroxysmal atrial fibrillation: Secondary | ICD-10-CM | POA: Diagnosis not present

## 2019-10-28 ENCOUNTER — Other Ambulatory Visit: Payer: Self-pay

## 2019-12-08 ENCOUNTER — Ambulatory Visit (INDEPENDENT_AMBULATORY_CARE_PROVIDER_SITE_OTHER): Payer: Medicare Other | Admitting: Otolaryngology

## 2019-12-08 ENCOUNTER — Other Ambulatory Visit: Payer: Self-pay

## 2019-12-08 ENCOUNTER — Encounter (INDEPENDENT_AMBULATORY_CARE_PROVIDER_SITE_OTHER): Payer: Self-pay | Admitting: Otolaryngology

## 2019-12-08 VITALS — Temp 97.3°F

## 2019-12-08 DIAGNOSIS — L918 Other hypertrophic disorders of the skin: Secondary | ICD-10-CM

## 2019-12-08 DIAGNOSIS — H903 Sensorineural hearing loss, bilateral: Secondary | ICD-10-CM | POA: Diagnosis not present

## 2019-12-08 DIAGNOSIS — H6123 Impacted cerumen, bilateral: Secondary | ICD-10-CM | POA: Diagnosis not present
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
## 2023-02-19 ENCOUNTER — Other Ambulatory Visit: Payer: Self-pay | Admitting: Urology

## 2023-02-20 NOTE — Progress Notes (Signed)
Talked with patient, son in the background helping to answer questions. Arrival time 1030. Nothing to eat or drink after MN. Driver is secured, Hx and Meds reviewed. Okay to take am meds with a sip  of water before coming in. Bring blue folder in

## 2023-02-22 ENCOUNTER — Encounter (HOSPITAL_BASED_OUTPATIENT_CLINIC_OR_DEPARTMENT_OTHER): Payer: Self-pay | Admitting: Urology

## 2023-02-22 ENCOUNTER — Ambulatory Visit (HOSPITAL_BASED_OUTPATIENT_CLINIC_OR_DEPARTMENT_OTHER)
Admission: RE | Admit: 2023-02-22 | Discharge: 2023-02-22 | Disposition: A | Discharge status: Home / Self Care | Payer: Medicare Other | Attending: Urology | Admitting: Urology

## 2023-02-22 ENCOUNTER — Ambulatory Visit (HOSPITAL_COMMUNITY): Payer: Medicare Other

## 2023-02-22 ENCOUNTER — Encounter (HOSPITAL_BASED_OUTPATIENT_CLINIC_OR_DEPARTMENT_OTHER)
Admission: RE | Disposition: A | Discharge status: Home / Self Care | Payer: Self-pay | Source: Home / Self Care | Attending: Urology

## 2023-02-22 DIAGNOSIS — Z87891 Personal history of nicotine dependence: Secondary | ICD-10-CM | POA: Diagnosis not present

## 2023-02-22 DIAGNOSIS — N200 Calculus of kidney: Secondary | ICD-10-CM | POA: Insufficient documentation

## 2023-02-22 DIAGNOSIS — I4891 Unspecified atrial fibrillation: Secondary | ICD-10-CM | POA: Diagnosis not present

## 2023-02-22 DIAGNOSIS — Z87442 Personal history of urinary calculi: Secondary | ICD-10-CM | POA: Insufficient documentation

## 2023-02-22 DIAGNOSIS — I10 Essential (primary) hypertension: Secondary | ICD-10-CM | POA: Diagnosis not present

## 2023-02-22 HISTORY — PX: EXTRACORPOREAL SHOCK WAVE LITHOTRIPSY: SHX1557

## 2023-02-22 SURGERY — LITHOTRIPSY, ESWL
Anesthesia: LOCAL | Laterality: Left

## 2023-02-22 MED ORDER — DIAZEPAM 5 MG PO TABS: 10.0000 mg | ORAL_TABLET | ORAL | Status: DC

## 2023-02-22 MED ORDER — DIPHENHYDRAMINE HCL 25 MG PO CAPS
25.0000 mg | ORAL_CAPSULE | ORAL | Status: AC
  Administered 2023-02-22: 25 mg via ORAL

## 2023-02-22 MED ORDER — CIPROFLOXACIN HCL 500 MG PO TABS
500.0000 mg | ORAL_TABLET | Freq: Once | ORAL | Status: AC
  Administered 2023-02-22: 500 mg via ORAL

## 2023-02-22 MED ORDER — CIPROFLOXACIN HCL 500 MG PO TABS
ORAL_TABLET | ORAL | Status: AC
  Filled 2023-02-22: qty 1

## 2023-02-22 MED ORDER — DIAZEPAM 5 MG PO TABS
5.0000 mg | ORAL_TABLET | ORAL | Status: AC
  Administered 2023-02-22: 5 mg via ORAL

## 2023-02-22 MED ORDER — DIAZEPAM 5 MG PO TABS
ORAL_TABLET | ORAL | Status: AC
  Filled 2023-02-22: qty 1

## 2023-02-22 MED ORDER — SODIUM CHLORIDE 0.9 % IV SOLN: INTRAVENOUS | Status: DC

## 2023-02-22 MED ORDER — CIPROFLOXACIN IN D5W 400 MG/200ML IV SOLN: 400.0000 mg | INTRAVENOUS | Status: DC

## 2023-02-22 MED ORDER — DIPHENHYDRAMINE HCL 25 MG PO CAPS
ORAL_CAPSULE | ORAL | Status: AC
  Filled 2023-02-22: qty 1

## 2023-02-22 NOTE — Progress Notes (Signed)
Bladder scan performed with 450 ml result, after patient was unable to urinate for 1.5 hours and complained of bad pressure in his lower abdomen. Notified Dr. Marlou Porch who asked this nurse to discuss three options with patient and family. These options included wait it out longer, in and out catheterization with understanding if patient can not pee at home he may have to go to the ER this weekend, or foley catheterization with in office voiding trial next week. Dr. Marlou Porch asked for a call back with family decision.

## 2023-02-22 NOTE — Progress Notes (Signed)
Called Dr. Marlou Porch back with patient deciding to get a foley catheter for the weekend with follow up in the office next week. Order for 16 french foley catheter insertion received from Dr. Marlou Porch.

## 2023-02-22 NOTE — Op Note (Signed)
See [facility] OP note scanned into chart. Also because of the size, density, location and other factors that cannot be anticipated I feel this will likely be a staged procedure. This fact supersedes any indication in the scanned [facility] operative note to the contrary.  

## 2023-02-22 NOTE — Discharge Instructions (Addendum)
See [facility] discharge instructions in chart.   Post Anesthesia Home Care Instructions  Activity: Get plenty of rest for the remainder of the day. A responsible individual must stay with you for 24 hours following the procedure.  For the next 24 hours, DO NOT: -Drive a car -Paediatric nurse -Drink alcoholic beverages -Take any medication unless instructed by your physician -Make any legal decisions or sign important papers.  Meals: Start with liquid foods such as gelatin or soup. Progress to regular foods as tolerated. Avoid greasy, spicy, heavy foods. If nausea and/or vomiting occur, drink only clear liquids until the nausea and/or vomiting subsides. Call your physician if vomiting continues.  Special Instructions/Symptoms: Your throat may feel dry or sore from the anesthesia or the breathing tube placed in your throat during surgery. If this causes discomfort, gargle with warm salt water. The discomfort should disappear within 24 hours.

## 2023-02-25 ENCOUNTER — Encounter (HOSPITAL_BASED_OUTPATIENT_CLINIC_OR_DEPARTMENT_OTHER): Payer: Self-pay | Admitting: Urology

## 2023-02-25 NOTE — Plan of Care (Signed)
[facility] Tonsillectomy/Adenoidectomy, Postoperative PEDS care plan entered in error.

## 2023-02-28 NOTE — H&P (Signed)
89 year old male presents complaining of left sided abdominal pain that began 5 days ago. He is accompanied by his daughter. He notes his pain has been getting progressively worse and has increased from intermittent to constant pain. No improvement or worsening with position changes. He has taken Tylenol sparingly with mild improvement. He endorses vomiting a few days ago with continued nausea. Denies flank pain, dysuria, frequency, urgency, hematuria, fever/chills. Significant history of nephrolithiasis, last episode in 2011 treated with lithotripsy.     ALLERGIES:     MEDICATIONS: Allopurinol 300 mg tablet  Amlodipine Besylate 10 mg tablet  Nitroglycerin 0.4 mg tablet, sublingual Sublingual  PriLOSEC OTC TBEC Oral  Rosuvastatin Calcium 10 mg tablet  Tylenol Extra Strength 500 mg tablet     GU PSH: None     PSH Notes: Heart Surgery, cardiac surgery- cardiac electrophysiology study and ablation, Esophagectomy (partial)   NON-GU PSH: Appendectomy Cataract surgery, Bilateral Hernia Repair Tonsillectomy     GU PMH: BPH w/LUTS, Benign prostatic hyperplasia with urinary obstruction - 2014 ED due to arterial insufficiency, Erectile dysfunction due to arterial insufficiency - 2014 Other microscopic hematuria, Microscopic hematuria - 2014 Renal calculus, Nephrolithiasis - 2014 Renal colic, Renal colic - 2014 Ureteral calculus, Calculus of ureter - 2014      PMH Notes: Barretts Esophagus   [birth year]98[birth year] 00:00:00 - Note: Normal Routine History And 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 Smoked less than 1/2 pack per day.   Tobacco Use Assessment Completed: Used Tobacco in last 30 days? Drinks 4  drinks per week.     REVIEW OF SYSTEMS:    GU Review Male:   Patient reports hard to postpone urination and burning/ pain with urination. Patient denies frequent urination, get up at night to urinate, leakage of urine, stream starts and stops, trouble starting your stream, have to strain to urinate , erection problems, and penile pain.  Gastrointestinal (Upper):   Patient reports nausea. Patient denies vomiting and indigestion/ heartburn.  Gastrointestinal (Lower):   Patient denies diarrhea and constipation.  Constitutional:   Patient denies fever, night sweats, weight loss, and fatigue.  Skin:   Patient denies skin rash/ lesion and itching.  Eyes:   Patient denies blurred vision and double vision.  Ears/ Nose/ Throat:   Patient denies sore throat and sinus problems.  Hematologic/Lymphatic:   Patient denies swollen glands and easy bruising.  Cardiovascular:   Patient denies leg swelling and chest pains.  Respiratory:   Patient denies cough and shortness of breath.  Endocrine:   Patient denies excessive thirst.  Musculoskeletal:   Patient reports back pain. Patient denies joint pain.  Neurological:   Patient denies headaches and dizziness.  Psychologic:   Patient denies depression and anxiety.   VITAL SIGNS:      02/19/2023 02:06 PM  Weight 130 lb / 58.97 kg  BP 171/104 mmHg  Pulse 111 /min  Temperature 98.0 F / 36.6 C   MULTI-SYSTEM PHYSICAL EXAMINATION:    Constitutional: Well-nourished. No physical deformities. Normally developed. Good grooming.   Neck: Neck symmetrical, not swollen. Normal tracheal position.  Respiratory: Normal breath sounds. No labored breathing, no use of accessory muscles.   Cardiovascular: Regular rate and rhythm. No murmur, no gallop. Normal temperature, normal extremity pulses,  no swelling, no varicosities.   Lymphatic: No enlargement of neck, axillae, groin.  Skin: No paleness, no jaundice, no cyanosis. No lesion, no ulcer, no rash.  Neurologic /  Psychiatric: Oriented to time, oriented to place, oriented to person. No depression, no anxiety, no agitation.  Gastrointestinal: No mass, no tenderness, no rigidity, non obese abdomen.  Eyes: Normal conjunctivae. Normal eyelids.  Ears, Nose, Mouth, and Throat: Left ear no scars, no lesions, no masses. Right ear no scars, no lesions, no masses. Nose no scars, no lesions, no masses. Normal hearing. Normal lips.  Musculoskeletal: Normal gait and station of head and neck.     Complexity of Data:  Source Of History:  Patient  Records Review:   Previous Doctor Records, Previous Patient Records  Urine Test Review:   Urinalysis  X-Ray Review: C.T. Abdomen/Pelvis: Reviewed Films. Discussed With Patient.     PROCEDURES:         C.T. Urogram - [id number]      Patient confirmed No Neulasta OnPro Device.         Urinalysis w/Scope - 81001 Dipstick Dipstick Cont'd Micro  Color: Yellow Bilirubin: Neg WBC/hpf: NS (Not Seen)  Appearance: Clear Ketones: Trace RBC/hpf: 3 - 10/hpf  Specific Gravity: 1.025 Blood: 1+ Bacteria: NS (Not Seen)  pH: <=5.0 Protein: 1+ Cystals: NS (Not Seen)  Glucose: Neg Urobilinogen: 0.2 Casts: NS (Not Seen)    Nitrites: Neg Trichomonas: Not Present    Leukocyte Esterase: Neg Mucous: Not Present      Epithelial Cells: NS (Not Seen)      Yeast: NS (Not Seen)      Sperm: Not Present    Notes:            Ketoralac 30mg  - [id number], A2130 0 wasted   NDC#63323-162-00   Qty: 30 Adm. By: Roanna Banning  Unit: mg Lot No [id number]  Route: IM Exp. Date 02/05/2024  Freq: None Mfgr.:   Site: Right Buttock   ASSESSMENT:      ICD-10 Details  1 GU:   Renal calculus - N20.0   2   Renal colic - N23   3   Flank Pain - R10.84    PLAN:            Medications New Meds: Tramadol Hcl 50 mg tablet 1-2 tablet PO Q 6 H   #20  0 Refill(s)  Pharmacy Name:  [id number]  Address:  [address]   [place], [place] [address]  Phone:  [phone]  Fax:   [phone]            Orders X-Rays: [facility] Protocol Without I.V. Contrast  X-Ray Notes: . History:  Hematuria: Yes/No  Patient to see MD after exam: Yes/No  Previous exam: CT / IVP/ US/ KUB/ None  When:  Where:  Diabetic: Yes/ No  BUN/ Creatinine:  Date of last BUN Creatinine:  Weight in pounds:  Allergy- IV Contrast: Yes/ No  Conflicting diabetic meds: Yes/ No  Diabetic Meds:  Prior Authorization #: [id number]            Schedule         Document Letter(s):  Created for Patient: Clinical Summary         Notes:   The patient has a 9 mm left proximal ureteral stone. It is fairly easily visualized on today's scout CT scan. We discussed management strategies. I offered to place a stent tonight to help with his pain relief,  but the patient instead has opted to proceed with shockwave lithotripsy in the coming days. I went through the procedure with him in detail. He is gone through this before, he is very familiar with it. He is okay with excepting the risks associated with shockwave lithotripsy including perirenal hematoma, gross hematuria, ongoing pain and obstructing residual fragments.   The patient will need to stop his aspirin. He otherwise will continue with Tylenol and tramadol for his pain.

## 2023-11-08 ENCOUNTER — Other Ambulatory Visit: Payer: Self-pay

## 2023-11-08 ENCOUNTER — Encounter (HOSPITAL_COMMUNITY): Payer: Self-pay

## 2023-11-08 ENCOUNTER — Emergency Department (HOSPITAL_COMMUNITY)

## 2023-11-08 ENCOUNTER — Emergency Department (HOSPITAL_COMMUNITY)
Admission: EM | Admit: 2023-11-08 | Discharge: 2023-11-08 | Disposition: A | Discharge status: Home / Self Care | Attending: Emergency Medicine | Admitting: Emergency Medicine

## 2023-11-08 DIAGNOSIS — I700 Atherosclerosis of aorta: Secondary | ICD-10-CM | POA: Insufficient documentation

## 2023-11-08 DIAGNOSIS — R109 Unspecified abdominal pain: Secondary | ICD-10-CM | POA: Insufficient documentation

## 2023-11-08 DIAGNOSIS — K5730 Diverticulosis of large intestine without perforation or abscess without bleeding: Secondary | ICD-10-CM | POA: Insufficient documentation

## 2023-11-08 DIAGNOSIS — R339 Retention of urine, unspecified: Secondary | ICD-10-CM | POA: Insufficient documentation

## 2023-11-08 DIAGNOSIS — R110 Nausea: Secondary | ICD-10-CM | POA: Insufficient documentation

## 2023-11-08 DIAGNOSIS — N200 Calculus of kidney: Secondary | ICD-10-CM | POA: Insufficient documentation

## 2023-11-08 LAB — URINALYSIS, ROUTINE W REFLEX MICROSCOPIC
Bilirubin Urine: NEGATIVE
Glucose, UA: NEGATIVE mg/dL
Hgb urine dipstick: NEGATIVE
Ketones, ur: NEGATIVE mg/dL
Leukocytes,Ua: NEGATIVE
Nitrite: NEGATIVE
Protein, ur: NEGATIVE mg/dL
Specific Gravity, Urine: 1.012 (ref 1.005–1.030)
pH: 5 (ref 5.0–8.0)

## 2023-11-08 LAB — CBC
HCT: 38.3 % — ABNORMAL LOW (ref 39.0–52.0)
Hemoglobin: 12.1 g/dL — ABNORMAL LOW (ref 13.0–17.0)
MCH: 31.8 pg (ref 26.0–34.0)
MCHC: 31.6 g/dL (ref 30.0–36.0)
MCV: 100.5 fL — ABNORMAL HIGH (ref 80.0–100.0)
Platelets: 182 K/uL (ref 150–400)
RBC: 3.81 MIL/uL — ABNORMAL LOW (ref 4.22–5.81)
RDW: 13.6 % (ref 11.5–15.5)
WBC: 6.8 K/uL (ref 4.0–10.5)
nRBC: 0 % (ref 0.0–0.2)

## 2023-11-08 LAB — BASIC METABOLIC PANEL WITH GFR
Anion gap: 10 (ref 5–15)
BUN: 30 mg/dL — ABNORMAL HIGH (ref 8–23)
CO2: 20 mmol/L — ABNORMAL LOW (ref 22–32)
Calcium: 9 mg/dL (ref 8.9–10.3)
Chloride: 109 mmol/L (ref 98–111)
Creatinine, Ser: 2.37 mg/dL — ABNORMAL HIGH (ref 0.61–1.24)
GFR, Estimated: 25 mL/min — ABNORMAL LOW (ref >60)
Glucose, Bld: 139 mg/dL — ABNORMAL HIGH (ref 70–99)
Potassium: 4 mmol/L (ref 3.5–5.1)
Sodium: 139 mmol/L (ref 135–145)

## 2023-11-08 NOTE — ED Triage Notes (Signed)
 Patient has been having trouble urinating since yesterday morning. Unable to fully empty bladder since. Denies prostate problem. Has had a foley catheter last October. No abdominal pain.

## 2023-11-08 NOTE — ED Provider Notes (Signed)
 Carlos Webster AT [facility] Provider Note   CSN: [id number] Arrival date & time: 11/08/23  1123     Patient presents with: Urinary Retention   Carlos Webster is a 89 y.o. male.   HPI   89 year old male presents emergency Webster concern for urinary retention.  Past medical history of kidney stones that did require lithotripsy.  Denies any acute flank pain, fever.  He has some mild nausea but no other symptoms.  Prior to Admission medications   Medication Sig Start Date End Date Taking? Authorizing Provider  allopurinol  (ZYLOPRIM ) 300 MG tablet Take 300 mg by mouth daily.      [provider]  amLODipine  (NORVASC ) 10 MG tablet Take 1 tablet (10 mg total) by mouth daily. 09/10/14   Fairy Frames, MD  diphenhydramine -acetaminophen  (TYLENOL  PM) 25-500 MG TABS tablet Take 1 tablet by mouth at bedtime as needed.    [provider]  Neomycin-Bacitracin-Polymyxin (HCA TRIPLE ANTIBIOTIC OINTMENT EX) Apply 1 application topically daily as needed (skin tear).    [provider]  rosuvastatin  (CRESTOR ) 10 MG tablet Take 10 mg by mouth 2 (two) times a week.      [provider]  traMADol (ULTRAM) 50 MG tablet Take 50 mg by mouth every 6 (six) hours as needed for moderate pain (pain score 4-6).    [provider]    Allergies: Patient has no known allergies.    Review of Systems  Constitutional:  Negative for fever.  Respiratory:  Negative for shortness of breath.   Cardiovascular:  Negative for chest pain.  Gastrointestinal:  Negative for abdominal pain, diarrhea and vomiting.  Genitourinary:  Positive for difficulty urinating.  Skin:  Negative for rash.  Neurological:  Negative for headaches.    Updated Vital Signs BP (!) 154/82 (BP Location: Right Arm)   Pulse 75   Temp 98 F (36.7 C) (Oral)   Resp 16   Ht 5' 6 (1.676 m)   Wt 54.4 kg   SpO2 100%   BMI 19.37 kg/m   Physical Exam Vitals and nursing  note reviewed.  Constitutional:      General: He is not in acute distress.    Appearance: Normal appearance. He is not ill-appearing.  HENT:     Head: Normocephalic.     Mouth/Throat:     Mouth: Mucous membranes are moist.  Cardiovascular:     Rate and Rhythm: Normal rate.  Pulmonary:     Effort: Pulmonary effort is normal. No respiratory distress.  Abdominal:     Palpations: Abdomen is soft.     Tenderness: There is no abdominal tenderness.  Skin:    General: Skin is warm.  Neurological:     Mental Status: He is alert and oriented to person, place, and time. Mental status is at baseline.  Psychiatric:        Mood and Affect: Mood normal.     (all labs ordered are listed, but only abnormal results are displayed) Labs Reviewed  BASIC METABOLIC PANEL WITH GFR - Abnormal; Notable for the following components:      Result Value   CO2 20 (*)    Glucose, Bld 139 (*)    BUN 30 (*)    Creatinine, Ser 2.37 (*)    GFR, Estimated 25 (*)    All other components within normal limits  CBC - Abnormal; Notable for the following components:   RBC 3.81 (*)    Hemoglobin 12.1 (*)  HCT 38.3 (*)    MCV 100.5 (*)    All other components within normal limits  URINALYSIS, ROUTINE W REFLEX MICROSCOPIC    EKG: None  Radiology: CT Renal Stone Study Result Date: 11/08/2023 CLINICAL DATA:  Abdominal/flank pain, stone suspected Difficulty urinating since yesterday morning with inability to empty the bladder. Chronic indwelling Foley catheter. EXAM: CT ABDOMEN AND PELVIS WITHOUT CONTRAST TECHNIQUE: Multidetector CT imaging of the abdomen and pelvis was performed following the standard protocol without IV contrast. RADIATION DOSE REDUCTION: This exam was performed according to the departmental dose-optimization program which includes automated exposure control, adjustment of the mA and/or kV according to patient size and/or use of iterative reconstruction technique. COMPARISON:  Abdominopelvic CT  02/19/2023. FINDINGS: Lower chest: Mild chronic subpleural reticulation at both lung bases. Trace left pleural effusion. No confluent airspace disease. There are postsurgical changes from previous esophagectomy with gastric pull-through. Aortic and coronary artery atherosclerosis noted. Hepatobiliary: The liver appears unremarkable as imaged in the noncontrast state. No evidence of gallstones, gallbladder wall thickening or biliary dilatation. Pancreas: Unremarkable. No pancreatic ductal dilatation or surrounding inflammatory changes. Spleen: Normal in size without focal abnormality. Adrenals/Urinary Tract: Both adrenal glands appear normal. There are small nonobstructing calculi in the lower pole of the left kidney. No evidence of ureteral calculus or hydronephrosis. Mild renal cortical thinning, left greater than right. The bladder is decompressed by a Foley catheter. A small amount of air is present within the bladder lumen. No focal bladder abnormality identified. However, there is suspicion of a 9 mm calculus within the prostatic urethra (coronal image 74/8). Stomach/Bowel: No enteric contrast administered. As above, postsurgical changes from esophagectomy and gastric pull-through. No evidence of bowel distension, wall thickening or surrounding inflammation. The appendix appears normal. There are diverticular changes throughout the descending and sigmoid colon without evidence of acute inflammation. Vascular/Lymphatic: There are no enlarged abdominal or pelvic lymph nodes. Aortic and branch vessel atherosclerosis without evidence of aneurysm. Reproductive: The prostate gland appears moderately enlarged. Other: No evidence of abdominal wall mass or hernia. No ascites or pneumoperitoneum. Musculoskeletal: No acute or significant osseous findings. Multilevel spondylosis with partially ankylosing osteophytes. The sacroiliac joints appear chronically ankylosed bilaterally. IMPRESSION: 1. Suspected 9 mm calculus  within the prostatic urethra. No evidence of ureteral calculus or hydronephrosis. 2. Small nonobstructing left renal calculi. 3. The bladder is decompressed by a Foley catheter. 4. Distal colonic diverticulosis without evidence of acute inflammation. 5. Postsurgical changes from previous esophagectomy and gastric pull-through. 6.  Aortic Atherosclerosis (ICD10-I70.0). Electronically Signed   By: Elsie Perone M.D.   On: 11/08/2023 13:46     Procedures   Medications Ordered in the ED - No data to display                                  Medical Decision Making Amount and/or Complexity of Data Reviewed Labs: ordered. Radiology: ordered.   89 year old male presents emergency Webster concern for urinary retention.  Large amount on bladder scan, Foley placed immediately and the patient drained over a liter of urine.  Vitals are normal and stable.  Abdomen is benign.  Blood work shows slight increase in creatinine from baseline.  Urinalysis shows no infection.  CT scan shows a calcification around the prostate urethra, questionable prostate calcification versus stone in that area.  Foley bypasses this.  Consulted on-call urologist, Dr.Sninsky who recommends keeping the Foley in place, and following as an  outpatient with urology service.  Kidney function not an indication for emergent admission at this time now that he is freely draining urine.  Patient at this time appears safe and stable for discharge and close outpatient follow up. Discharge plan and strict return to ED precautions discussed, patient verbalizes understanding and agreement.     Final diagnoses:  Urinary retention    ED Discharge Orders     None          Bari Roxie HERO, DO 11/08/23 1556

## 2023-11-08 NOTE — Discharge Instructions (Signed)
 You have been seen and discharged from the emergency department.  You were found to have urinary obstruction with slight increase of your kidney function.  A Foley was placed for relief.  On the CAT scan there is a calcification around the prostate, questionable prostate calcification versus urethral stone.  Recommendations from urology is to keep the Foley in place and see you in the office next week for further care.  Follow-up with your primary provider for further evaluation and further care. Take home medications as prescribed. If you have any worsening symptoms or further concerns for your health please return to an emergency department for further evaluation.
